# Patient Record
Sex: Female | Born: 1965 | Race: Black or African American | Hispanic: No | Marital: Single | Smoking: Never smoker
Health system: Southern US, Community
[De-identification: ages and names within clinical notes are randomized; demographics above are authoritative.]

## PROBLEM LIST (undated history)

## (undated) DIAGNOSIS — F329 Major depressive disorder, single episode, unspecified: Secondary | ICD-10-CM

## (undated) DIAGNOSIS — F32A Depression, unspecified: Secondary | ICD-10-CM

## (undated) DIAGNOSIS — E785 Hyperlipidemia, unspecified: Secondary | ICD-10-CM

## (undated) DIAGNOSIS — E119 Type 2 diabetes mellitus without complications: Secondary | ICD-10-CM

## (undated) DIAGNOSIS — C801 Malignant (primary) neoplasm, unspecified: Secondary | ICD-10-CM

## (undated) DIAGNOSIS — I10 Essential (primary) hypertension: Secondary | ICD-10-CM

## (undated) DIAGNOSIS — F419 Anxiety disorder, unspecified: Secondary | ICD-10-CM

## (undated) HISTORY — DX: Type 2 diabetes mellitus without complications: E11.9

## (undated) HISTORY — PX: CHOLECYSTECTOMY: SHX55

## (undated) HISTORY — DX: Major depressive disorder, single episode, unspecified: F32.9

## (undated) HISTORY — DX: Hyperlipidemia, unspecified: E78.5

## (undated) HISTORY — DX: Essential (primary) hypertension: I10

## (undated) HISTORY — DX: Anxiety disorder, unspecified: F41.9

## (undated) HISTORY — PX: BARIATRIC SURGERY: SHX1103

## (undated) HISTORY — DX: Depression, unspecified: F32.A

---

## 2012-12-22 HISTORY — PX: CAROTID STENT: SHX1301

## 2014-09-02 ENCOUNTER — Emergency Department: Payer: Self-pay | Admitting: Student

## 2014-11-18 ENCOUNTER — Emergency Department: Payer: Managed Care, Other (non HMO)

## 2014-11-18 ENCOUNTER — Emergency Department
Admission: EM | Admit: 2014-11-18 | Discharge: 2014-11-18 | Disposition: A | Discharge status: Home / Self Care | Payer: Managed Care, Other (non HMO) | Attending: Emergency Medicine | Admitting: Emergency Medicine

## 2014-11-18 DIAGNOSIS — K8020 Calculus of gallbladder without cholecystitis without obstruction: Secondary | ICD-10-CM | POA: Insufficient documentation

## 2014-11-18 DIAGNOSIS — R1011 Right upper quadrant pain: Secondary | ICD-10-CM | POA: Diagnosis present

## 2014-11-18 DIAGNOSIS — Z7982 Long term (current) use of aspirin: Secondary | ICD-10-CM | POA: Insufficient documentation

## 2014-11-18 DIAGNOSIS — R109 Unspecified abdominal pain: Secondary | ICD-10-CM

## 2014-11-18 DIAGNOSIS — Z3202 Encounter for pregnancy test, result negative: Secondary | ICD-10-CM | POA: Insufficient documentation

## 2014-11-18 DIAGNOSIS — Z79899 Other long term (current) drug therapy: Secondary | ICD-10-CM | POA: Diagnosis not present

## 2014-11-18 DIAGNOSIS — R112 Nausea with vomiting, unspecified: Secondary | ICD-10-CM

## 2014-11-18 LAB — COMPREHENSIVE METABOLIC PANEL
ALT: 14 U/L (ref 14–54)
AST: 18 U/L (ref 15–41)
Albumin: 3.5 g/dL (ref 3.5–5.0)
Alkaline Phosphatase: 90 U/L (ref 38–126)
Anion gap: 9 (ref 5–15)
BUN: 13 mg/dL (ref 6–20)
CHLORIDE: 103 mmol/L (ref 101–111)
CO2: 27 mmol/L (ref 22–32)
Calcium: 8.2 mg/dL — ABNORMAL LOW (ref 8.9–10.3)
Creatinine, Ser: 0.89 mg/dL (ref 0.44–1.00)
GFR calc Af Amer: >60 mL/min (ref >60)
Glucose, Bld: 143 mg/dL — ABNORMAL HIGH (ref 65–99)
POTASSIUM: 3.6 mmol/L (ref 3.5–5.1)
SODIUM: 139 mmol/L (ref 135–145)
TOTAL PROTEIN: 7 g/dL (ref 6.5–8.1)
Total Bilirubin: 0.5 mg/dL (ref 0.3–1.2)

## 2014-11-18 LAB — URINALYSIS COMPLETE WITH MICROSCOPIC (ARMC ONLY)
Bacteria, UA: NONE SEEN
Bilirubin Urine: NEGATIVE
Glucose, UA: NEGATIVE mg/dL
HGB URINE DIPSTICK: NEGATIVE
Ketones, ur: NEGATIVE mg/dL
LEUKOCYTES UA: NEGATIVE
Nitrite: NEGATIVE
Protein, ur: NEGATIVE mg/dL
SPECIFIC GRAVITY, URINE: 1.017 (ref 1.005–1.030)
pH: 5 (ref 5.0–8.0)

## 2014-11-18 LAB — TROPONIN I: Troponin I: <0.03 ng/mL (ref <0.031)

## 2014-11-18 LAB — CBC WITH DIFFERENTIAL/PLATELET
BASOS ABS: 0 10*3/uL (ref 0–0.1)
Basophils Relative: 0 %
Eosinophils Absolute: 0.1 10*3/uL (ref 0–0.7)
Eosinophils Relative: 1 %
HCT: 39 % (ref 35.0–47.0)
HEMOGLOBIN: 12.8 g/dL (ref 12.0–16.0)
LYMPHS PCT: 29 %
Lymphs Abs: 1.4 10*3/uL (ref 1.0–3.6)
MCH: 29.8 pg (ref 26.0–34.0)
MCHC: 32.7 g/dL (ref 32.0–36.0)
MCV: 91.1 fL (ref 80.0–100.0)
MONO ABS: 0.5 10*3/uL (ref 0.2–0.9)
Monocytes Relative: 10 %
Neutro Abs: 2.9 10*3/uL (ref 1.4–6.5)
Neutrophils Relative %: 60 %
Platelets: 183 10*3/uL (ref 150–440)
RBC: 4.28 MIL/uL (ref 3.80–5.20)
RDW: 14.6 % — ABNORMAL HIGH (ref 11.5–14.5)
WBC: 4.8 10*3/uL (ref 3.6–11.0)

## 2014-11-18 LAB — PREGNANCY, URINE: Preg Test, Ur: NEGATIVE

## 2014-11-18 LAB — LIPASE, BLOOD: Lipase: 42 U/L (ref 22–51)

## 2014-11-18 MED ORDER — ONDANSETRON HCL 4 MG PO TABS: 4.0000 mg | ORAL_TABLET | Freq: Every day | ORAL | Status: DC | PRN

## 2014-11-18 MED ORDER — DICYCLOMINE HCL 20 MG PO TABS: 20.0000 mg | ORAL_TABLET | Freq: Three times a day (TID) | ORAL | Status: DC | PRN

## 2014-11-18 MED ORDER — SODIUM CHLORIDE 0.9 % IV BOLUS (SEPSIS)
1000.0000 mL | Freq: Once | INTRAVENOUS | Status: AC
  Administered 2014-11-18: 1000 mL via INTRAVENOUS

## 2014-11-18 MED ORDER — OXYCODONE-ACETAMINOPHEN 5-325 MG PO TABS: 1.0000 | ORAL_TABLET | Freq: Four times a day (QID) | ORAL | Status: DC | PRN

## 2014-11-18 NOTE — ED Notes (Signed)
Labs redrawn and relabeled per lab request. Awaiting urine specimen.

## 2014-11-18 NOTE — Discharge Instructions (Signed)
Biliary Colic  Biliary colic is a steady or irregular pain in the upper abdomen. It is usually under the right side of the rib cage. It happens when gallstones interfere with the normal flow of bile from the gallbladder. Bile is a liquid that helps to digest fats. Bile is made in the liver and stored in the gallbladder. When you eat a meal, bile passes from the gallbladder through the cystic duct and the common bile duct into the small intestine. There, it mixes with partially digested food. If a gallstone blocks either of these ducts, the normal flow of bile is blocked. The muscle cells in the bile duct contract forcefully to try to move the stone. This causes the pain of biliary colic.  SYMPTOMS   A person with biliary colic usually complains of pain in the upper abdomen. This pain can be:  In the center of the upper abdomen just below the breastbone.  In the upper-right part of the abdomen, near the gallbladder and liver.  Spread back toward the right shoulder blade.  Nausea and vomiting.  The pain usually occurs after eating.  Biliary colic is usually triggered by the digestive system's demand for bile. The demand for bile is high after fatty meals. Symptoms can also occur when a person who has been fasting suddenly eats a very large meal. Most episodes of biliary colic pass after 1 to 5 hours. After the most intense pain passes, your abdomen may continue to ache mildly for about 24 hours. DIAGNOSIS  After you describe your symptoms, your caregiver will perform a physical exam. He or she will pay attention to the upper right portion of your belly (abdomen). This is the area of your liver and gallbladder. An ultrasound will help your caregiver look for gallstones. Specialized scans of the gallbladder may also be done. Blood tests may be done, especially if you have fever or if your pain persists. PREVENTION  Biliary colic can be prevented by controlling the risk factors for gallstones. Some of  these risk factors, such as heredity, increasing age, and pregnancy are a normal part of life. Obesity and a high-fat diet are risk factors you can change through a healthy lifestyle. Women going through menopause who take hormone replacement therapy (estrogen) are also more likely to develop biliary colic. TREATMENT   Pain medication may be prescribed.  You may be encouraged to eat a fat-free diet.  If the first episode of biliary colic is severe, or episodes of colic keep retuning, surgery to remove the gallbladder (cholecystectomy) is usually recommended. This procedure can be done through small incisions using an instrument called a laparoscope. The procedure often requires a brief stay in the hospital. Some people can leave the hospital the same day. It is the most widely used treatment in people troubled by painful gallstones. It is effective and safe, with no complications in more than 90% of cases.  If surgery cannot be done, medication that dissolves gallstones may be used. This medication is expensive and can take months or years to work. Only small stones will dissolve.  Rarely, medication to dissolve gallstones is combined with a procedure called shock-wave lithotripsy. This procedure uses carefully aimed shock waves to break up gallstones. In many people treated with this procedure, gallstones form again within a few years. PROGNOSIS  If gallstones block your cystic duct or common bile duct, you are at risk for repeated episodes of biliary colic. There is also a 25% chance that you will develop  a gallbladder infection(acute cholecystitis), or some other complication of gallstones within 10 to 20 years. If you have surgery, schedule it at a time that is convenient for you and at a time when you are not sick. HOME CARE INSTRUCTIONS   Drink plenty of clear fluids.  Avoid fatty, greasy or fried foods, or any foods that make your pain worse.  Take medications as directed. SEEK MEDICAL  CARE IF:   You develop a fever over 100.5 F (38.1 C).  Your pain gets worse over time.  You develop nausea that prevents you from eating and drinking.  You develop vomiting. SEEK IMMEDIATE MEDICAL CARE IF:   You have continuous or severe belly (abdominal) pain which is not relieved with medications.  You develop nausea and vomiting which is not relieved with medications.  You have symptoms of biliary colic and you suddenly develop a fever and shaking chills. This may signal cholecystitis. Call your caregiver immediately.  You develop a yellow color to your skin or the white part of your eyes (jaundice). Document Released: 10/30/2005 Document Revised: 08/21/2011 Document Reviewed: 01/09/2008 [facility] Patient Information 2015 [place], [place]. This information is not intended to replace advice given to you by your health care provider. Make sure you discuss any questions you have with your health care provider.

## 2014-11-18 NOTE — ED Notes (Signed)
Patient resting comfortably on stretcher. Awaiting further orders. Family at bedside.
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
## 2014-11-18 NOTE — ED Notes (Signed)
Patient ambulatory to triage with steady gait, without difficulty or distress noted; pt reports since Monday having N/V/D and chills; pain to right side abd radiating around into back

## 2014-11-23 ENCOUNTER — Encounter: Payer: Self-pay | Admitting: General Surgery

## 2014-11-23 ENCOUNTER — Ambulatory Visit (INDEPENDENT_AMBULATORY_CARE_PROVIDER_SITE_OTHER): Payer: Managed Care, Other (non HMO) | Admitting: General Surgery

## 2014-11-23 VITALS — BP 136/78 | HR 78 | Resp 16 | Ht 70.0 in | Wt 334.0 lb

## 2014-11-23 DIAGNOSIS — K8020 Calculus of gallbladder without cholecystitis without obstruction: Secondary | ICD-10-CM

## 2014-11-23 NOTE — Progress Notes (Signed)
Patient ID: Tamara Meyer, female   DOB: 06-Mar-1966, 49 y.o.   MRN: [id number]  Chief Complaint  Patient presents with  . Other    gallbladder    HPI Tamara Meyer is a 48 y.o. female here for assessment of possible gallstones. Abdominal pain started on 11/16/14. Pain worsened on 11/18/14 and she went to the ER, where she had a chest xray and abdominal ultrasound. The pain radiates from the right upper quadrant and extends towards her back. The pain is constant.  Positive for nausea and vomiting. Has not been able to eat regularly, only able to keep down bread or toast. She does move bowel regularly, she received a prescription for the pain and has caused some constipation. Had a prior episode in 2015 and ER physician suggested acid reflux medication. Has discussed bariatric surgery with PCP. Prior sleep study, but unsure if this indicated sleep apnea. Does not currently use a CPAP.  HPI  Past Medical History  Diagnosis Date  . Diabetes mellitus without complication   . Hyperlipidemia     Past Surgical History  Procedure Laterality Date  . Carotid stent  12/22/12    Family History  Problem Relation Age of Onset  . Heart disease Mother   . Cancer Mother     BREAST CANCER     Social History History  Substance Use Topics  . Smoking status: Never Smoker   . Smokeless tobacco: Never Used  . Alcohol Use: No    Allergies  Allergen Reactions  . Lisinopril Swelling and Other (See Comments)    Reaction: Throat closes    Current Outpatient Prescriptions  Medication Sig Dispense Refill  . aspirin EC 81 MG tablet Take 81 mg by mouth daily.    . carvedilol (COREG) 12.5 MG tablet Take 12.5 mg by mouth daily.    . Dapagliflozin-Metformin HCl ER (XIGDUO XR) 10-998 MG TB24 Take 2 tablets by mouth daily.    Marland Kitchen dicyclomine (BENTYL) 20 MG tablet Take 1 tablet (20 mg total) by mouth 3 (three) times daily as needed for spasms. 12 tablet 0  . gabapentin (NEURONTIN) 600 MG  tablet Take 600 mg by mouth daily.    Marland Kitchen ibuprofen (ADVIL,MOTRIN) 800 MG tablet Take 800 mg by mouth 3 (three) times daily as needed for moderate pain.    Marland Kitchen insulin aspart (NOVOLOG FLEXPEN) 100 UNIT/ML FlexPen Inject 40 Units into the skin 3 (three) times daily with meals.    . Insulin Detemir (LEVEMIR FLEXPEN) 100 UNIT/ML Pen Inject 50 Units into the skin 2 (two) times daily.    . ondansetron (ZOFRAN) 4 MG tablet Take 1 tablet (4 mg total) by mouth daily as needed for nausea or vomiting. 10 tablet 1  . oxyCODONE-acetaminophen (ROXICET) 5-325 MG per tablet Take 1-2 tablets by mouth every 6 (six) hours as needed. 15 tablet 0  . rosuvastatin (CRESTOR) 40 MG tablet Take 40 mg by mouth daily.     No current facility-administered medications for this visit.    Review of Systems Review of Systems  Constitutional: Positive for chills and appetite change.  Respiratory: Negative.   Gastrointestinal: Positive for nausea, vomiting and constipation.    Blood pressure 136/78, pulse 78, resp. rate 16, height 5' 10"  (1.778 m), weight 334 lb (151.501 kg), last menstrual period 10/27/2014.  Physical Exam Physical Exam  Constitutional: She is oriented to person, place, and time. She appears well-developed and well-nourished.  Eyes: No scleral icterus.  Neck: Neck supple.  Cardiovascular: Normal rate, regular  rhythm and normal heart sounds.   Pulmonary/Chest: Effort normal and breath sounds normal.  Abdominal: Soft. Bowel sounds are normal. She exhibits no distension and no mass. There is no hepatomegaly. There is no tenderness. There is no rebound and no guarding.  Lymphadenopathy:    She has no cervical adenopathy.  Neurological: She is alert and oriented to person, place, and time.  Skin: Skin is warm and dry.    Data Reviewed RUQ Ultrasound from 11/18/14 shows cholelithiasis without cholecystitis.  CBC and CMP and Lipase show normal levels.   Assessment    Cholelithiasis without obstruction.  Weight is biggest risk factor. Has a BMI of 48. She likely also has sleep apnea. It appears she had one attacke of pain last yr and one  few days ago.Has discussed bariatric surgery with PCP previously but no progress on that. Given her risk status feel lap/cholecystectomy can be very technically challenging and operative risks are very high. Discuss fully with pt. Will get bariatric surgery consult.      Plan    Discussed risk of surgery with current weight. Suggest referral to bariatric surgeon to discuss weight loss and advice prior to when cholecystectomy can be considered. Control potential gallbladder symptoms by eating bland food that does not have fat and grease      PCP: Vicki Mallet 11/23/2014, 3:51 PM

## 2014-11-23 NOTE — Patient Instructions (Addendum)
Control potential gallbladder symptoms by eating bland food that does not have fat and grease. Will make appointment with bariatric surgeon to discuss weight loss

## 2016-04-16 ENCOUNTER — Emergency Department
Admission: EM | Admit: 2016-04-16 | Discharge: 2016-04-16 | Disposition: A | Discharge status: Home / Self Care | Payer: BLUE CROSS/BLUE SHIELD | Attending: Emergency Medicine | Admitting: Emergency Medicine

## 2016-04-16 ENCOUNTER — Emergency Department: Payer: BLUE CROSS/BLUE SHIELD

## 2016-04-16 ENCOUNTER — Encounter: Payer: Self-pay | Admitting: Emergency Medicine

## 2016-04-16 DIAGNOSIS — Z7982 Long term (current) use of aspirin: Secondary | ICD-10-CM | POA: Insufficient documentation

## 2016-04-16 DIAGNOSIS — R05 Cough: Secondary | ICD-10-CM

## 2016-04-16 DIAGNOSIS — R079 Chest pain, unspecified: Secondary | ICD-10-CM | POA: Diagnosis not present

## 2016-04-16 DIAGNOSIS — R059 Cough, unspecified: Secondary | ICD-10-CM

## 2016-04-16 DIAGNOSIS — R0609 Other forms of dyspnea: Secondary | ICD-10-CM | POA: Insufficient documentation

## 2016-04-16 DIAGNOSIS — E119 Type 2 diabetes mellitus without complications: Secondary | ICD-10-CM | POA: Diagnosis not present

## 2016-04-16 DIAGNOSIS — Z79899 Other long term (current) drug therapy: Secondary | ICD-10-CM | POA: Insufficient documentation

## 2016-04-16 DIAGNOSIS — Z794 Long term (current) use of insulin: Secondary | ICD-10-CM | POA: Diagnosis not present

## 2016-04-16 LAB — CBC
HCT: 35.6 % (ref 35.0–47.0)
Hemoglobin: 12.1 g/dL (ref 12.0–16.0)
MCH: 30.6 pg (ref 26.0–34.0)
MCHC: 33.9 g/dL (ref 32.0–36.0)
MCV: 90.1 fL (ref 80.0–100.0)
PLATELETS: 216 10*3/uL (ref 150–440)
RBC: 3.95 MIL/uL (ref 3.80–5.20)
RDW: 15.3 % — ABNORMAL HIGH (ref 11.5–14.5)
WBC: 10.3 10*3/uL (ref 3.6–11.0)

## 2016-04-16 LAB — BASIC METABOLIC PANEL
Anion gap: 9 (ref 5–15)
BUN: 11 mg/dL (ref 6–20)
CHLORIDE: 103 mmol/L (ref 101–111)
CO2: 27 mmol/L (ref 22–32)
CREATININE: 0.86 mg/dL (ref 0.44–1.00)
Calcium: 8.9 mg/dL (ref 8.9–10.3)
GFR calc Af Amer: >60 mL/min (ref >60)
GFR calc non Af Amer: >60 mL/min (ref >60)
Glucose, Bld: 153 mg/dL — ABNORMAL HIGH (ref 65–99)
Potassium: 4.2 mmol/L (ref 3.5–5.1)
Sodium: 139 mmol/L (ref 135–145)

## 2016-04-16 LAB — TROPONIN I: Troponin I: <0.03 ng/mL (ref <0.03)

## 2016-04-16 LAB — BRAIN NATRIURETIC PEPTIDE: B NATRIURETIC PEPTIDE 5: 69 pg/mL (ref 0.0–100.0)

## 2016-04-16 MED ORDER — FUROSEMIDE 10 MG/ML IJ SOLN
40.0000 mg | Freq: Once | INTRAMUSCULAR | Status: AC
  Administered 2016-04-16: 40 mg via INTRAVENOUS
  Filled 2016-04-16: qty 4

## 2016-04-16 MED ORDER — ASPIRIN 81 MG PO CHEW
324.0000 mg | CHEWABLE_TABLET | Freq: Once | ORAL | Status: AC
  Administered 2016-04-16: 324 mg via ORAL
  Filled 2016-04-16: qty 4

## 2016-04-16 MED ORDER — BENZONATATE 100 MG PO CAPS
100.0000 mg | ORAL_CAPSULE | Freq: Once | ORAL | Status: AC
  Administered 2016-04-16: 100 mg via ORAL
  Filled 2016-04-16: qty 1

## 2016-04-16 NOTE — ED Triage Notes (Signed)
Pt reports central CP that radaites to left arm for several days. Pt also reports SOB.

## 2016-04-16 NOTE — ED Notes (Signed)
Patient transported to X-ray 

## 2016-04-16 NOTE — ED Provider Notes (Signed)
[facility] Emergency Department Provider Note  ____________________________________________  Time seen: Approximately 9:27 AM  I have reviewed the triage vital signs and the nursing notes.   HISTORY  Chief Complaint Chest Pain and Shortness of Breath    HPI Tamara Meyer is a 50 y.o. female with a history of CAD status post stent 2014, COPD and asthma, morbid obesity, DM, HL presenting with productive cough, left arm pain, left neck pain, chest pain, and shortness of breath. The patient reports that she returned from a beach vacation 10/25, and initially had intermittent left arm pain from the shoulder all the way down the arm. She also developed a cough that was initially nonproductive, but now is productive of thick sputum. She is noted progressively worsening dyspnea on exertion, with decreased exercise tolerance. She has been usingher albuterol inhaler with minimal relief. No orthopnea; she chronically sleeps using 4 pillows, which is unchanged. She denies any ear pain, sore throat, congestion or rhinorrhea. She has also been having an intermittent central chest pain without palpitations, lightheadedness or syncope. Her chest pain is better if she sits up and hold her breasts. This also alleviates her shortness of breath. She occasionally also has left neck pain. She has also intermittently had bilateral lower extremity swelling, although does not have this today. The patient has no appointment to see Dr. Ubaldo Glassing, her cardiologist, on 11/14; she has not had any risk stratification study since her cardiac catheterization in 2014 when the stent was placed.   Past Medical History:  Diagnosis Date  . Diabetes mellitus without complication ([place])   . Hyperlipidemia     Patient Active Problem List   Diagnosis Date Noted  . Cholelithiasis without obstruction 11/23/2014    Past Surgical History:  Procedure Laterality Date  . CAROTID STENT  12/22/12  Patient  denies carotid stent. She reports coronary artery stent.  Current Outpatient Rx  . Order #: [id number] Class: Historical Med  . Order #: [id number] Class: Historical Med  . Order #: [id number] Class: Historical Med  . Order #: [id number] Class: Print  . Order #: [id number] Class: Historical Med  . Order #: [id number] Class: Historical Med  . Order #: [id number] Class: Historical Med  . Order #: [id number] Class: Historical Med  . Order #: [id number] Class: Print  . Order #: [id number] Class: Print  . Order #: [id number] Class: Historical Med    Allergies Lisinopril  Family History  Problem Relation Age of Onset  . Heart disease Mother   . Cancer Mother     BREAST CANCER     Social History Social History  Substance Use Topics  . Smoking status: Never Smoker  . Smokeless tobacco: Never Used  . Alcohol use No    Review of Systems Constitutional: No fever/chills.No lightheadedness or syncope. No diaphoresis. Eyes: No visual changes. No eye discharge. ENT: No sore throat. No congestion or rhinorrhea. No ear pain. Cardiovascular: Positive central chest pain. Denies palpitations. Respiratory: Positive dyspnea on exertion, no orthopnea.  Positive productive cough. Gastrointestinal: No abdominal pain.  No nausea, no vomiting.  No diarrhea.  No constipation. Genitourinary: Negative for dysuria. Musculoskeletal: Negative for back pain. Positive left neck pain. Positive left upper extremity pain. Skin: Negative for rash. Neurological: Negative for headaches. No focal numbness, tingling or weakness.   10-point ROS otherwise negative.  ____________________________________________   PHYSICAL EXAM:  VITAL SIGNS: ED Triage Vitals  Enc Vitals Group     BP 04/16/16 0855 (!) 144/82     Pulse Rate 04/16/16 0855 92  Resp 04/16/16 0855 (!) 22     Temp 04/16/16 0855 99.3 F (37.4 C)     Temp Source 04/16/16 0855 Oral     SpO2 04/16/16 0855 92 %     Weight 04/16/16 0853 (!) 397 lb (180.1 kg)      Height 04/16/16 0853 5' 10"  (1.778 m)     Head Circumference --      Peak Flow --      Pain Score 04/16/16 0853 9     Pain Loc --      Pain Edu? --      Excl. in [place]? --     Constitutional: Alert and oriented. Chronically ill appearing but in no acute distress. Answers questions appropriately. Patient is sitting comfortably in the stretcher, smiling, and moves about without discomfort. Eyes: Conjunctivae are normal.  EOMI. No scleral icterus. No eye discharge. Head: Atraumatic. Nose: No congestion/rhinnorhea. Mouth/Throat: Mucous membranes are moist.  Neck: No stridor.  Supple.  No JVD. No meningismus. Cardiovascular: Heart sounds are distant due to body habitus. Normal rate, regular rhythm. No murmurs, rubs or gallops.  Respiratory: Normal respiratory effort.  No accessory muscle use or retractions. Lungs CTAB.  No wheezes, rales or ronchi. Gastrointestinal: Obese. Soft, nontender and nondistended.  No guarding or rebound.  No peritoneal signs. Musculoskeletal: No LE edema. No ttp in the calves or palpable cords.  Negative Homan's sign. Neurologic:  A&Ox3.  Speech is clear.  Face and smile are symmetric.  EOMI.  Moves all extremities well. Skin:  Skin is warm, dry and intact. No rash noted. Psychiatric: Mood and affect are normal. Speech and behavior are normal.  Normal judgement.  ____________________________________________   LABS (all labs ordered are listed, but only abnormal results are displayed)  Labs Reviewed  BASIC METABOLIC PANEL - Abnormal; Notable for the following:       Result Value   Glucose, Bld 153 (*)    All other components within normal limits  CBC - Abnormal; Notable for the following:    RDW 15.3 (*)    All other components within normal limits  TROPONIN I  BRAIN NATRIURETIC PEPTIDE  TROPONIN I   ____________________________________________  EKG  ED ECG REPORT I, Eula Listen, the attending physician, personally viewed and interpreted this  ECG.   Date: 04/16/2016  EKG Time: 855  Rate: 92  Rhythm: normal sinus rhythm  Axis: normal  Intervals:none  ST&T Change: Nonspecific T-wave inversion in V1. No ST elevation.  ____________________________________________  [id number]  Dg Chest 2 View  Result Date: 04/16/2016 CLINICAL DATA:  Shortness of breath for several weeks EXAM: CHEST  2 VIEW COMPARISON:  11/18/2014 FINDINGS: Cardiac shadow is at the upper limits of normal in size. The lungs are well aerated bilaterally. No focal infiltrate or sizable effusion is seen. Mild vascular congestion is noted consistent with early CHF. No bony abnormality is noted. IMPRESSION: Early CHF Electronically Signed   By: Inez Catalina M.D.   On: 04/16/2016 09:49    ____________________________________________   PROCEDURES  Procedure(s) performed: None  Procedures  Critical Care performed: No ____________________________________________   INITIAL IMPRESSION / ASSESSMENT AND PLAN / ED COURSE  Pertinent labs & imaging results that were available during my care of the patient were reviewed by me and considered in my medical decision making (see chart for details).  50 y.o. female with CAD status post stent, COPD and asthma, presenting with productive cough, dyspnea on exertion, chest pain, left upper extremity pain, left neck pain,  and intermittent lower extremity swelling. It is possible that the patient's symptoms are due to viral URI, but given her multiple cardiac and pulmonary comorbidities, we'll also evaluate her for ACS or MI, arrhythmia, pneumonia. This patient has not had any risk stratification study in 3 years.  PE or aortic pathology is less likely. It is possible the patient has CHF although I do not hear any pulmonary edema or see any signs of peripheral edema on my examination.  ----------------------------------------- 12:43 PM on 04/16/2016 -----------------------------------------  At this time, the patient states that  she is feeling much better. She is not having any chest pain. The patient's cardiac workup is reassuring with a normal BNP, troponin negative 2, and EKG without ischemic changes. Her chest x-ray may show some early pulmonary edema, which I have treated with Lasix and she has had good urine output from this. She does feel like she is breathing well at this time.  I will discuss the patient's clinical history and findings with Dr. Ubaldo Glassing, for final disposition.  ----------------------------------------- 12:50 PM on 04/16/2016 -----------------------------------------  I have spoken with Dr. Ubaldo Glassing, who agrees the plan and will see the patient in clinic. ____________________________________________  FINAL CLINICAL IMPRESSION(S) / ED DIAGNOSES  Final diagnoses:  Dyspnea on exertion  Chest pain, unspecified type  Cough    Clinical Course as of Apr 16 1249  Sun Apr 16, 2016  1029 The patient's chest x-ray may show some early pulmonary edema, although clinically her oxygenation is normal, I do not hear any rales or rhonchi, and she has no significant peripheral edema. Her BNP today is 69. I will treat her with 40 mg IV of Lasix, to see if this helps relieve some of her dyspnea on exertion. Her initial troponin is also negative and the remainder of her labs are reassuring, including a normal white blood cell count. We will await a second troponin, and then discuss final disposition with Dr. Ubaldo Glassing.  [AN]    Clinical Course User Index [AN] Eula Listen, MD      NEW MEDICATIONS STARTED DURING THIS VISIT:  New Prescriptions   No medications on file      Eula Listen, MD 04/16/16 1250

## 2016-04-16 NOTE — Discharge Instructions (Signed)
Please continue to take your albuterol as needed for coughing spasms or shortness of breath.  Please call Dr. Velia Meyer office tomorrow morning, to schedule a follow-up appointment as soon as possible.  Return to the emergency department if you develop chest pain, shortness of breath, lightheadedness or fainting, swelling of the legs, fever, or any other symptoms concerning to you.

## 2016-04-21 ENCOUNTER — Other Ambulatory Visit: Payer: Self-pay | Admitting: Cardiology

## 2016-04-21 DIAGNOSIS — R0602 Shortness of breath: Secondary | ICD-10-CM

## 2016-04-21 DIAGNOSIS — I2510 Atherosclerotic heart disease of native coronary artery without angina pectoris: Secondary | ICD-10-CM

## 2016-04-27 ENCOUNTER — Ambulatory Visit
Admission: RE | Admit: 2016-04-27 | Discharge: 2016-04-27 | Disposition: A | Discharge status: Home / Self Care | Payer: BLUE CROSS/BLUE SHIELD | Source: Ambulatory Visit | Attending: Cardiology | Admitting: Cardiology

## 2016-04-27 ENCOUNTER — Encounter
Admission: RE | Admit: 2016-04-27 | Discharge: 2016-04-27 | Disposition: A | Discharge status: Home / Self Care | Payer: BLUE CROSS/BLUE SHIELD | Source: Ambulatory Visit | Attending: Cardiology | Admitting: Cardiology

## 2016-04-27 DIAGNOSIS — I2510 Atherosclerotic heart disease of native coronary artery without angina pectoris: Secondary | ICD-10-CM

## 2016-04-27 DIAGNOSIS — R0602 Shortness of breath: Secondary | ICD-10-CM

## 2016-04-27 MED ORDER — PERFLUTREN LIPID MICROSPHERE
1.0000 mL | INTRAVENOUS | Status: AC | PRN
  Administered 2016-04-27: 2 mL via INTRAVENOUS
  Filled 2016-04-27: qty 10

## 2016-04-27 MED ORDER — REGADENOSON 0.4 MG/5ML IV SOLN
0.4000 mg | Freq: Once | INTRAVENOUS | Status: AC
  Administered 2016-04-27: 0.4 mg via INTRAVENOUS

## 2016-04-27 MED ORDER — TECHNETIUM TC 99M TETROFOSMIN IV KIT
32.2800 | PACK | Freq: Once | INTRAVENOUS | Status: AC | PRN
  Administered 2016-04-27: 32.28 via INTRAVENOUS

## 2016-04-27 NOTE — Progress Notes (Signed)
  Echocardiogram 2D Echocardiogram has been performed.  Jennette Dubin 04/27/2016, 11:36 AM

## 2016-04-28 ENCOUNTER — Encounter
Admission: RE | Admit: 2016-04-28 | Discharge: 2016-04-28 | Disposition: A | Discharge status: Home / Self Care | Payer: BLUE CROSS/BLUE SHIELD | Source: Ambulatory Visit | Attending: Cardiology | Admitting: Cardiology

## 2016-04-28 DIAGNOSIS — R0602 Shortness of breath: Secondary | ICD-10-CM | POA: Diagnosis not present

## 2016-04-28 MED ORDER — TECHNETIUM TC 99M TETROFOSMIN IV KIT
30.0370 | PACK | Freq: Once | INTRAVENOUS | Status: AC | PRN
  Administered 2016-04-28: 30.037 via INTRAVENOUS

## 2016-07-13 LAB — NM MYOCAR MULTI W/SPECT W/WALL MOTION / EF
CHL CUP NUCLEAR SRS: 10
CHL CUP RESTING HR STRESS: 78 {beats}/min
CSEPED: 1 min
CSEPHR: 56 %
CSEPPHR: 98 {beats}/min
Estimated workload: 1 METS
Exercise duration (sec): 1 s
LV sys vol: 72 mL
LVDIAVOL: 136 mL (ref 46–106)
MPHR: 172 {beats}/min
SDS: 5
SSS: 13
TID: 1.1

## 2016-07-27 ENCOUNTER — Ambulatory Visit: Payer: BLUE CROSS/BLUE SHIELD | Attending: Specialist

## 2016-08-16 ENCOUNTER — Ambulatory Visit: Payer: BLUE CROSS/BLUE SHIELD | Attending: Specialist

## 2016-08-16 DIAGNOSIS — G4733 Obstructive sleep apnea (adult) (pediatric): Secondary | ICD-10-CM | POA: Diagnosis present

## 2017-03-07 DIAGNOSIS — Z9989 Dependence on other enabling machines and devices: Secondary | ICD-10-CM | POA: Insufficient documentation

## 2017-03-07 DIAGNOSIS — K7581 Nonalcoholic steatohepatitis (NASH): Secondary | ICD-10-CM | POA: Insufficient documentation

## 2017-03-07 DIAGNOSIS — E1165 Type 2 diabetes mellitus with hyperglycemia: Secondary | ICD-10-CM | POA: Insufficient documentation

## 2017-03-07 DIAGNOSIS — G4733 Obstructive sleep apnea (adult) (pediatric): Secondary | ICD-10-CM | POA: Insufficient documentation

## 2017-03-07 DIAGNOSIS — Z794 Long term (current) use of insulin: Secondary | ICD-10-CM

## 2017-03-07 DIAGNOSIS — I10 Essential (primary) hypertension: Secondary | ICD-10-CM | POA: Insufficient documentation

## 2017-03-07 DIAGNOSIS — E119 Type 2 diabetes mellitus without complications: Secondary | ICD-10-CM | POA: Insufficient documentation

## 2017-03-08 DIAGNOSIS — Z955 Presence of coronary angioplasty implant and graft: Secondary | ICD-10-CM | POA: Insufficient documentation

## 2017-03-08 DIAGNOSIS — I2510 Atherosclerotic heart disease of native coronary artery without angina pectoris: Secondary | ICD-10-CM | POA: Insufficient documentation

## 2017-03-28 ENCOUNTER — Ambulatory Visit: Payer: BLUE CROSS/BLUE SHIELD | Attending: Internal Medicine

## 2017-03-28 DIAGNOSIS — R2689 Other abnormalities of gait and mobility: Secondary | ICD-10-CM | POA: Insufficient documentation

## 2017-03-28 DIAGNOSIS — G8929 Other chronic pain: Secondary | ICD-10-CM | POA: Diagnosis present

## 2017-03-28 DIAGNOSIS — M6281 Muscle weakness (generalized): Secondary | ICD-10-CM | POA: Diagnosis present

## 2017-03-28 DIAGNOSIS — M5441 Lumbago with sciatica, right side: Secondary | ICD-10-CM | POA: Insufficient documentation
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
## 2017-04-03 ENCOUNTER — Ambulatory Visit: Payer: BLUE CROSS/BLUE SHIELD

## 2017-04-09 DIAGNOSIS — K760 Fatty (change of) liver, not elsewhere classified: Secondary | ICD-10-CM | POA: Insufficient documentation

## 2017-04-10 ENCOUNTER — Ambulatory Visit: Payer: BLUE CROSS/BLUE SHIELD

## 2017-04-10 DIAGNOSIS — M5441 Lumbago with sciatica, right side: Principal | ICD-10-CM

## 2017-04-10 DIAGNOSIS — G8929 Other chronic pain: Secondary | ICD-10-CM

## 2017-04-10 DIAGNOSIS — M6281 Muscle weakness (generalized): Secondary | ICD-10-CM

## 2017-04-10 DIAGNOSIS — R2689 Other abnormalities of gait and mobility: Secondary | ICD-10-CM
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
## 2017-04-12 ENCOUNTER — Ambulatory Visit: Payer: BLUE CROSS/BLUE SHIELD

## 2017-04-17 ENCOUNTER — Ambulatory Visit: Payer: BLUE CROSS/BLUE SHIELD | Attending: Internal Medicine

## 2017-04-17 DIAGNOSIS — M5441 Lumbago with sciatica, right side: Secondary | ICD-10-CM | POA: Insufficient documentation

## 2017-04-17 DIAGNOSIS — G8929 Other chronic pain: Secondary | ICD-10-CM | POA: Insufficient documentation

## 2017-04-17 DIAGNOSIS — R2689 Other abnormalities of gait and mobility: Secondary | ICD-10-CM

## 2017-04-17 DIAGNOSIS — M6281 Muscle weakness (generalized): Secondary | ICD-10-CM | POA: Diagnosis present
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
## 2017-04-19 ENCOUNTER — Ambulatory Visit: Payer: BLUE CROSS/BLUE SHIELD

## 2017-04-24 ENCOUNTER — Ambulatory Visit: Payer: BLUE CROSS/BLUE SHIELD

## 2017-04-26 ENCOUNTER — Ambulatory Visit: Payer: BLUE CROSS/BLUE SHIELD

## 2017-05-01 ENCOUNTER — Ambulatory Visit: Payer: BLUE CROSS/BLUE SHIELD

## 2017-05-07 ENCOUNTER — Emergency Department: Payer: BLUE CROSS/BLUE SHIELD

## 2017-05-07 ENCOUNTER — Encounter: Payer: Self-pay | Admitting: Emergency Medicine

## 2017-05-07 ENCOUNTER — Emergency Department
Admission: EM | Admit: 2017-05-07 | Discharge: 2017-05-07 | Disposition: A | Discharge status: Home / Self Care | Payer: BLUE CROSS/BLUE SHIELD | Attending: Emergency Medicine | Admitting: Emergency Medicine

## 2017-05-07 DIAGNOSIS — Z955 Presence of coronary angioplasty implant and graft: Secondary | ICD-10-CM | POA: Diagnosis not present

## 2017-05-07 DIAGNOSIS — Z794 Long term (current) use of insulin: Secondary | ICD-10-CM | POA: Diagnosis not present

## 2017-05-07 DIAGNOSIS — I10 Essential (primary) hypertension: Secondary | ICD-10-CM | POA: Insufficient documentation

## 2017-05-07 DIAGNOSIS — E119 Type 2 diabetes mellitus without complications: Secondary | ICD-10-CM | POA: Insufficient documentation

## 2017-05-07 DIAGNOSIS — K219 Gastro-esophageal reflux disease without esophagitis: Secondary | ICD-10-CM | POA: Diagnosis not present

## 2017-05-07 DIAGNOSIS — J449 Chronic obstructive pulmonary disease, unspecified: Secondary | ICD-10-CM | POA: Insufficient documentation

## 2017-05-07 DIAGNOSIS — R0602 Shortness of breath: Secondary | ICD-10-CM | POA: Insufficient documentation

## 2017-05-07 DIAGNOSIS — I2510 Atherosclerotic heart disease of native coronary artery without angina pectoris: Secondary | ICD-10-CM | POA: Insufficient documentation

## 2017-05-07 DIAGNOSIS — R1012 Left upper quadrant pain: Secondary | ICD-10-CM | POA: Diagnosis present

## 2017-05-07 DIAGNOSIS — Z79899 Other long term (current) drug therapy: Secondary | ICD-10-CM | POA: Insufficient documentation

## 2017-05-07 LAB — HEPATIC FUNCTION PANEL
ALT: 16 U/L (ref 14–54)
AST: 20 U/L (ref 15–41)
Albumin: 3.9 g/dL (ref 3.5–5.0)
Alkaline Phosphatase: 104 U/L (ref 38–126)
BILIRUBIN TOTAL: 0.6 mg/dL (ref 0.3–1.2)
Bilirubin, Direct: <0.1 mg/dL — ABNORMAL LOW (ref 0.1–0.5)
TOTAL PROTEIN: 7.8 g/dL (ref 6.5–8.1)

## 2017-05-07 LAB — URINALYSIS, COMPLETE (UACMP) WITH MICROSCOPIC
BACTERIA UA: NONE SEEN
BILIRUBIN URINE: NEGATIVE
GLUCOSE, UA: NEGATIVE mg/dL
Ketones, ur: NEGATIVE mg/dL
Leukocytes, UA: NEGATIVE
NITRITE: NEGATIVE
PROTEIN: NEGATIVE mg/dL
Specific Gravity, Urine: 1.018 (ref 1.005–1.030)
pH: 5 (ref 5.0–8.0)

## 2017-05-07 LAB — BASIC METABOLIC PANEL
ANION GAP: 12 (ref 5–15)
BUN: 11 mg/dL (ref 6–20)
CALCIUM: 9.3 mg/dL (ref 8.9–10.3)
CO2: 24 mmol/L (ref 22–32)
Chloride: 101 mmol/L (ref 101–111)
Creatinine, Ser: 0.82 mg/dL (ref 0.44–1.00)
GLUCOSE: 132 mg/dL — AB (ref 65–99)
Potassium: 3.9 mmol/L (ref 3.5–5.1)
Sodium: 137 mmol/L (ref 135–145)

## 2017-05-07 LAB — BRAIN NATRIURETIC PEPTIDE: B NATRIURETIC PEPTIDE 5: 72 pg/mL (ref 0.0–100.0)

## 2017-05-07 LAB — INFLUENZA PANEL BY PCR (TYPE A & B)
INFLAPCR: NEGATIVE
INFLBPCR: NEGATIVE

## 2017-05-07 LAB — TROPONIN I

## 2017-05-07 LAB — CBC
HEMATOCRIT: 37.7 % (ref 35.0–47.0)
HEMOGLOBIN: 12.8 g/dL (ref 12.0–16.0)
MCH: 29.5 pg (ref 26.0–34.0)
MCHC: 34 g/dL (ref 32.0–36.0)
MCV: 86.8 fL (ref 80.0–100.0)
Platelets: 239 10*3/uL (ref 150–440)
RBC: 4.34 MIL/uL (ref 3.80–5.20)
RDW: 15.3 % — ABNORMAL HIGH (ref 11.5–14.5)
WBC: 7.8 10*3/uL (ref 3.6–11.0)

## 2017-05-07 LAB — PREGNANCY, URINE: Preg Test, Ur: NEGATIVE

## 2017-05-07 LAB — LIPASE, BLOOD: Lipase: 28 U/L (ref 11–51)

## 2017-05-07 MED ORDER — METOCLOPRAMIDE HCL 10 MG PO TABS: 10.0000 mg | ORAL_TABLET | Freq: Three times a day (TID) | ORAL | 0 refills | Status: DC | PRN

## 2017-05-07 MED ORDER — MAGNESIUM CITRATE PO SOLN: 1.0000 | Freq: Once | ORAL | 0 refills | Status: AC

## 2017-05-07 MED ORDER — FAMOTIDINE 20 MG PO TABS
20.0000 mg | ORAL_TABLET | Freq: Once | ORAL | Status: AC
  Administered 2017-05-07: 20 mg via ORAL
  Filled 2017-05-07: qty 1

## 2017-05-07 MED ORDER — SODIUM CHLORIDE 0.9 % IV BOLUS (SEPSIS)
500.0000 mL | Freq: Once | INTRAVENOUS | Status: AC
  Administered 2017-05-07: 500 mL via INTRAVENOUS

## 2017-05-07 MED ORDER — ONDANSETRON HCL 4 MG/2ML IJ SOLN
4.0000 mg | Freq: Once | INTRAMUSCULAR | Status: AC
  Administered 2017-05-07: 4 mg via INTRAVENOUS
  Filled 2017-05-07: qty 2

## 2017-05-07 MED ORDER — IOPAMIDOL (ISOVUE-300) INJECTION 61%
125.0000 mL | Freq: Once | INTRAVENOUS | Status: AC | PRN
  Administered 2017-05-07: 125 mL via INTRAVENOUS

## 2017-05-07 MED ORDER — IOPAMIDOL (ISOVUE-300) INJECTION 61%
30.0000 mL | Freq: Once | INTRAVENOUS | Status: AC | PRN
  Administered 2017-05-07: 30 mL via ORAL

## 2017-05-07 MED ORDER — METOCLOPRAMIDE HCL 10 MG PO TABS
10.0000 mg | ORAL_TABLET | Freq: Once | ORAL | Status: AC
  Administered 2017-05-07: 10 mg via ORAL
  Filled 2017-05-07: qty 1

## 2017-05-07 MED ORDER — MORPHINE SULFATE (PF) 2 MG/ML IV SOLN
2.0000 mg | Freq: Once | INTRAVENOUS | Status: AC
  Administered 2017-05-07: 2 mg via INTRAVENOUS
  Filled 2017-05-07: qty 1

## 2017-05-07 MED ORDER — FAMOTIDINE 20 MG PO TABS: 20.0000 mg | ORAL_TABLET | Freq: Two times a day (BID) | ORAL | 0 refills | Status: DC

## 2017-05-07 NOTE — ED Triage Notes (Signed)
Pt comes into the ED via chest pain that started Saturday, but the patient states she is now vomiting as well.  Patient states she has also not been able to have a bowel movement since Saturday.  Patient has even and unlabored respirations and the chest pain is on the left side that radiates to the back.  Patient also complains of shortness of breath associated with her symptoms.

## 2017-05-07 NOTE — ED Notes (Signed)
Patient transported to CT 

## 2017-05-07 NOTE — Discharge Instructions (Signed)
Take the Pepcid as prescribed to decrease the acid and pain going into your chest.  You may take the Reglan as needed for nausea, and the magnesium citrate as needed for constipation.  Return to the emergency department for new, worsening, or persistent vomiting, abdominal pain, fevers, difficulty breathing or chest pain, weakness, or any other new or worsening symptoms that concern you.  You have a small amount of fluid at the bottom of the right lung, called a "pleural effusion," which does not seem to be related to your symptoms today, but should be further evaluated by your regular doctor.  You also have a cyst on your left ovary which again is not related to your symptoms today but should be followed up by a gynecologist.

## 2017-05-07 NOTE — ED Provider Notes (Signed)
[facility] Emergency Department Provider Note ____________________________________________   First MD Initiated Contact with Patient 05/07/17 1619     (approximate)  I have reviewed the triage vital signs and the nursing notes.   HISTORY  Chief Complaint Chest Pain    HPI Tamara Meyer is a 51 y.o. female with a history of diabetes, hypertension, hyperlipidemia, CAD status post stenting 2014, COPD and asthma who presents with primarily left upper quadrant abdominal pain over the last several days, gradual onset, radiating to the chest and under the left breast, and associated with generalized malaise and weakness, some shortness of breath, nausea, and several episodes of vomiting.  Patient also states that she has not had a bowel movement in the last several days, which is abnormal for her.  She states her abdomen is slightly distended.  She denies any cough or fever.  She states that a few weeks ago she had flulike symptoms, and states that she feels worse now than she did then.  Past Medical History:  Diagnosis Date  . Diabetes mellitus without complication ([place])   . Hyperlipidemia     Patient Active Problem List   Diagnosis Date Noted  . Cholelithiasis without obstruction 11/23/2014    Past Surgical History:  Procedure Laterality Date  . CAROTID STENT  12/22/12    Prior to Admission medications   Medication Sig Start Date End Date Taking? Authorizing Provider  aspirin EC 81 MG tablet Take 81 mg by mouth daily.    [provider]  carvedilol (COREG) 12.5 MG tablet Take 12.5 mg by mouth daily.    [provider]  Dapagliflozin-Metformin HCl ER (XIGDUO XR) 10-998 MG TB24 Take 2 tablets by mouth daily.    [provider]  dicyclomine (BENTYL) 20 MG tablet Take 1 tablet (20 mg total) by mouth 3 (three) times daily as needed for spasms. 11/18/14 11/18/15  Orbie Pyo, MD  gabapentin (NEURONTIN) 600 MG  tablet Take 600 mg by mouth daily.    [provider]  ibuprofen (ADVIL,MOTRIN) 800 MG tablet Take 800 mg by mouth 3 (three) times daily as needed for moderate pain.    [provider]  insulin aspart (NOVOLOG FLEXPEN) 100 UNIT/ML FlexPen Inject 40 Units into the skin 3 (three) times daily with meals.    [provider]  Insulin Detemir (LEVEMIR FLEXPEN) 100 UNIT/ML Pen Inject 50 Units into the skin 2 (two) times daily.    [provider]  ondansetron (ZOFRAN) 4 MG tablet Take 1 tablet (4 mg total) by mouth daily as needed for nausea or vomiting. 11/18/14   Schaevitz, Randall An, MD  oxyCODONE-acetaminophen (ROXICET) 5-325 MG per tablet Take 1-2 tablets by mouth every 6 (six) hours as needed. 11/18/14   Orbie Pyo, MD  rosuvastatin (CRESTOR) 40 MG tablet Take 40 mg by mouth daily.    [provider]    Allergies Lisinopril  Family History  Problem Relation Age of Onset  . Heart disease Mother   . Cancer Mother        BREAST CANCER     Social History Social History   Tobacco Use  . Smoking status: Never Smoker  . Smokeless tobacco: Never Used  Substance Use Topics  . Alcohol use: No    Alcohol/week: 0.0 oz  . Drug use: No    Review of Systems  Constitutional: No fever/chills.  Positive for generalized weakness. Eyes: No visual changes. ENT: No sore throat. Cardiovascular: Denies chest pain.  Respiratory: Positive for shortness of breath. Gastrointestinal: Positive for nausea and vomiting.  Genitourinary: Negative for dysuria or frequency.  Musculoskeletal: Negative for back pain. Skin: Negative for rash. Neurological: Negative for headaches, focal weakness or numbness.   ____________________________________________   PHYSICAL EXAM:  VITAL SIGNS: ED Triage Vitals  Enc Vitals Group     BP 05/07/17 1420 (!) 147/60     Pulse Rate 05/07/17 1417 75     Resp 05/07/17 1417 18     Temp 05/07/17 1417 98.4 F (36.9  C)     Temp Source 05/07/17 1417 Oral     SpO2 05/07/17 1417 95 %     Weight 05/07/17 1419 (!) 409 lb (185.5 kg)     Height 05/07/17 1419 5' 10"  (1.778 m)     Head Circumference --      Peak Flow --      Pain Score 05/07/17 1417 10     Pain Loc --      Pain Edu? --      Excl. in [place]? --     Constitutional: Alert and oriented.  Obese.  Relatively well appearing and in no acute distress. Eyes: Conjunctivae are normal.  EOMI. Head: Atraumatic. Nose: No congestion/rhinnorhea. Mouth/Throat: Mucous membranes are slightly dry.   Neck: Normal range of motion.  Cardiovascular: Normal rate, regular rhythm. Grossly normal heart sounds.  Good peripheral circulation. Respiratory: Normal respiratory effort.  No retractions. Lungs CTAB. Gastrointestinal: Soft with mild left upper quadrant tenderness.  Slightly distended. Genitourinary: No CVA tenderness. Musculoskeletal: No lower extremity edema.  Extremities warm and well perfused.  Neurologic:  Normal speech and language. No gross focal neurologic deficits are appreciated.  Skin:  Skin is warm and dry. No rash noted. Psychiatric: Mood and affect are normal. Speech and behavior are normal.  ____________________________________________   LABS (all labs ordered are listed, but only abnormal results are displayed)  Labs Reviewed  BASIC METABOLIC PANEL - Abnormal; Notable for the following components:      Result Value   Glucose, Bld 132 (*)    All other components within normal limits  CBC - Abnormal; Notable for the following components:   RDW 15.3 (*)    All other components within normal limits  HEPATIC FUNCTION PANEL - Abnormal; Notable for the following components:   Bilirubin, Direct <0.1 (*)    All other components within normal limits  URINALYSIS, COMPLETE (UACMP) WITH MICROSCOPIC - Abnormal; Notable for the following components:   Color, Urine YELLOW (*)    APPearance CLEAR (*)    Hgb urine dipstick SMALL (*)    Squamous  Epithelial / LPF 0-5 (*)    All other components within normal limits  TROPONIN I  BRAIN NATRIURETIC PEPTIDE  LIPASE, BLOOD  INFLUENZA PANEL BY PCR (TYPE A & B)  PREGNANCY, URINE   ____________________________________________  EKG  ED ECG REPORT I, Arta Silence, the attending physician, personally viewed and interpreted this ECG.  Date: 05/07/2017 EKG Time: 1415 Rate: 80 Rhythm: normal sinus rhythm QRS Axis: normal Intervals: normal ST/T Wave abnormalities: normal Narrative Interpretation: no evidence of acute ischemia; no significant change when compared to EKG of 11/19/2014  ____________________________________________  RADIOLOGY  CT abd: minimal R pleural effusion; L ovarian cyst CXR: No acute findings  ____________________________________________   PROCEDURES  Procedure(s) performed: No    Critical Care performed: No ____________________________________________   INITIAL IMPRESSION / ASSESSMENT AND PLAN / ED COURSE  Pertinent labs & imaging results that were available during my  care of the patient were reviewed by me and considered in my medical decision making (see chart for details).  51 year old female with past medical history as noted above presents primarily with left upper quadrant abdominal pain associated with nausea and vomiting, abdominal distention, and decreased bowel movements in the last several days.  Patient reports that the pain does radiate somewhat to the chest, and she has some shortness of breath associated with it.  Patient also reports generalized weakness and malaise.  Review of past medical records in Epic is noncontributory, except for a visit last year for chest pain shortness of breath in which patient was ruled out for MI.  On exam, patient has normal vital signs except for slight hypertension, she is relatively comfortable appearing and the remainder the exam is as described with some abdominal tenderness.  Patient has  apparently had an attempted gastric bypass but the surgery was aborted; she has several small scars, but I am not sure based on patient's history whether the peritoneum was violated during this procedure.  Differential includes gastritis, PUD, GERD, less likely SBO or volvulus, or hepatobiliary cause.  Also consider UTI, flu/viral syndrome.  I have a low suspicion for ACS or other cardiac cause.  Given patient's increased risk will obtain troponin chest x-ray, but the pain appears to be originating in the abdomen.    ----------------------------------------- 7:19 PM on 05/07/2017 -----------------------------------------  Patient reports significantly improved pain, and she appears very comfortable.  Her vital signs are stable.  Her workup is largely negative.  Chest x-ray had no acute findings, UA is negative, flu is negative, and the remainder of the labs are unremarkable.  Her CT also does not show any significant findings.  She has a minimal right pleural effusion which is asymptomatic and likely chronic, and a left ovarian cyst which is not anywhere near where her pain has been.  Patient now more clearly describes a sensation of a sour taste and pain radiating up into her chest, so I suspect that the most likely etiology of her symptoms is GERD, gastritis, PUD, or gastroparesis related to her diabetes.  She also likely has simple constipation, this may be contributing to some of her left upper quadrant pain.  She feels well to go home, and there is no indication for further ED workup or observation at this time.  I will give Reglan and Pepcid for symptomatic treatment, and I will discharge the patient with a prescription for magnesium citrate for her constipation.  Return precautions given and patient expressed understanding.  She feels comfortable with the discharge plan. ____________________________________________   FINAL CLINICAL IMPRESSION(S) / ED DIAGNOSES  Final diagnoses:  Left upper  quadrant pain  Gastroesophageal reflux disease, esophagitis presence not specified      NEW MEDICATIONS STARTED DURING THIS VISIT:  This SmartLink is deprecated. Use AVSMEDLIST instead to display the medication list for a patient.   Note:  This document was prepared using Dragon voice recognition software and may include unintentional dictation errors.    Arta Silence, MD 05/07/17 1921

## 2017-05-14 ENCOUNTER — Ambulatory Visit: Payer: BLUE CROSS/BLUE SHIELD

## 2017-08-29 DIAGNOSIS — K219 Gastro-esophageal reflux disease without esophagitis: Secondary | ICD-10-CM | POA: Insufficient documentation

## 2017-09-17 ENCOUNTER — Ambulatory Visit: Payer: Medicare HMO | Admitting: Psychology

## 2017-09-17 DIAGNOSIS — F509 Eating disorder, unspecified: Secondary | ICD-10-CM

## 2017-09-24 ENCOUNTER — Ambulatory Visit: Payer: Medicare HMO | Admitting: Psychology

## 2017-09-24 DIAGNOSIS — F509 Eating disorder, unspecified: Secondary | ICD-10-CM

## 2017-10-08 ENCOUNTER — Other Ambulatory Visit: Payer: Self-pay

## 2017-10-08 ENCOUNTER — Ambulatory Visit: Payer: Medicare HMO | Admitting: Psychiatry

## 2017-10-08 ENCOUNTER — Encounter: Payer: Self-pay | Admitting: Psychiatry

## 2017-10-08 VITALS — BP 151/85 | HR 88 | Temp 98.1°F

## 2017-10-08 DIAGNOSIS — F330 Major depressive disorder, recurrent, mild: Secondary | ICD-10-CM | POA: Diagnosis not present

## 2017-10-08 DIAGNOSIS — F329 Major depressive disorder, single episode, unspecified: Secondary | ICD-10-CM | POA: Insufficient documentation

## 2017-10-08 DIAGNOSIS — J45909 Unspecified asthma, uncomplicated: Secondary | ICD-10-CM | POA: Insufficient documentation

## 2017-10-08 DIAGNOSIS — F32A Depression, unspecified: Secondary | ICD-10-CM | POA: Insufficient documentation

## 2017-10-08 DIAGNOSIS — Z6841 Body Mass Index (BMI) 40.0 and over, adult: Secondary | ICD-10-CM

## 2017-10-08 DIAGNOSIS — E559 Vitamin D deficiency, unspecified: Secondary | ICD-10-CM | POA: Insufficient documentation

## 2017-10-08 DIAGNOSIS — R809 Proteinuria, unspecified: Secondary | ICD-10-CM | POA: Insufficient documentation

## 2017-10-08 DIAGNOSIS — E1129 Type 2 diabetes mellitus with other diabetic kidney complication: Secondary | ICD-10-CM | POA: Insufficient documentation

## 2017-10-08 DIAGNOSIS — F5105 Insomnia due to other mental disorder: Secondary | ICD-10-CM

## 2017-10-08 DIAGNOSIS — F419 Anxiety disorder, unspecified: Secondary | ICD-10-CM | POA: Diagnosis not present

## 2017-10-08 DIAGNOSIS — N926 Irregular menstruation, unspecified: Secondary | ICD-10-CM | POA: Insufficient documentation

## 2017-10-08 DIAGNOSIS — E785 Hyperlipidemia, unspecified: Secondary | ICD-10-CM | POA: Insufficient documentation

## 2017-10-08 DIAGNOSIS — E1140 Type 2 diabetes mellitus with diabetic neuropathy, unspecified: Secondary | ICD-10-CM | POA: Insufficient documentation

## 2017-10-08 DIAGNOSIS — J309 Allergic rhinitis, unspecified: Secondary | ICD-10-CM | POA: Insufficient documentation

## 2017-10-08 MED ORDER — TRAZODONE HCL 50 MG PO TABS: 50.0000 mg | ORAL_TABLET | Freq: Every evening | ORAL | 1 refills | Status: DC | PRN

## 2017-10-08 MED ORDER — LAMOTRIGINE 25 MG PO TABS: 25.0000 mg | ORAL_TABLET | Freq: Every day | ORAL | 1 refills | Status: DC

## 2017-10-08 MED ORDER — CITALOPRAM HYDROBROMIDE 40 MG PO TABS: 40.0000 mg | ORAL_TABLET | Freq: Every day | ORAL | 1 refills | Status: AC
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
## 2017-10-08 NOTE — Patient Instructions (Signed)
Trazodone tablets What is this medicine? TRAZODONE (TRAZ oh done) is used to treat depression. This medicine may be used for other purposes; ask your health care provider or pharmacist if you have questions. COMMON BRAND NAME(S): Desyrel What should I tell my health care provider before I take this medicine? They need to know if you have any of these conditions: -attempted suicide or thinking about it -bipolar disorder -bleeding problems -glaucoma -heart disease, or previous heart attack -irregular heart beat -kidney or liver disease -low levels of sodium in the blood -an unusual or allergic reaction to trazodone, other medicines, foods, dyes or preservatives -pregnant or trying to get pregnant -breast-feeding How should I use this medicine? Take this medicine by mouth with a glass of water. Follow the directions on the prescription label. Take this medicine shortly after a meal or a light snack. Take your medicine at regular intervals. Do not take your medicine more often than directed. Do not stop taking this medicine suddenly except upon the advice of your doctor. Stopping this medicine too quickly may cause serious side effects or your condition may worsen. A special MedGuide will be given to you by the pharmacist with each prescription and refill. Be sure to read this information carefully each time. Talk to your pediatrician regarding the use of this medicine in children. Special care may be needed. Overdosage: If you think you have taken too much of this medicine contact a poison control center or emergency room at once. NOTE: This medicine is only for you. Do not share this medicine with others. What if I miss a dose? If you miss a dose, take it as soon as you can. If it is almost time for your next dose, take only that dose. Do not take double or extra doses. What may interact with this medicine? Do not take this medicine with any of the following medications: -certain medicines  for fungal infections like fluconazole, itraconazole, ketoconazole, posaconazole, voriconazole -cisapride -dofetilide -dronedarone -linezolid -MAOIs like Carbex, Eldepryl, Marplan, Nardil, and Parnate -mesoridazine -methylene blue (injected into a vein) -pimozide -saquinavir -thioridazine -ziprasidone This medicine may also interact with the following medications: -alcohol -antiviral medicines for HIV or AIDS -aspirin and aspirin-like medicines -barbiturates like phenobarbital -certain medicines for blood pressure, heart disease, irregular heart beat -certain medicines for depression, anxiety, or psychotic disturbances -certain medicines for migraine headache like almotriptan, eletriptan, frovatriptan, naratriptan, rizatriptan, sumatriptan, zolmitriptan -certain medicines for seizures like carbamazepine and phenytoin -certain medicines for sleep -certain medicines that treat or prevent blood clots like dalteparin, enoxaparin, warfarin -digoxin -fentanyl -lithium -NSAIDS, medicines for pain and inflammation, like ibuprofen or naproxen -other medicines that prolong the QT interval (cause an abnormal heart rhythm) -rasagiline -supplements like St. John's wort, kava kava, valerian -tramadol -tryptophan This list may not describe all possible interactions. Give your health care provider a list of all the medicines, herbs, non-prescription drugs, or dietary supplements you use. Also tell them if you smoke, drink alcohol, or use illegal drugs. Some items may interact with your medicine. What should I watch for while using this medicine? Tell your doctor if your symptoms do not get better or if they get worse. Visit your doctor or health care professional for regular checks on your progress. Because it may take several weeks to see the full effects of this medicine, it is important to continue your treatment as prescribed by your doctor. Patients and their families should watch out for new  or worsening thoughts of suicide or depression. Also  watch out for sudden changes in feelings such as feeling anxious, agitated, panicky, irritable, hostile, aggressive, impulsive, severely restless, overly excited and hyperactive, or not being able to sleep. If this happens, especially at the beginning of treatment or after a change in dose, call your health care professional. Dennis Bast may get drowsy or dizzy. Do not drive, use machinery, or do anything that needs mental alertness until you know how this medicine affects you. Do not stand or sit up quickly, especially if you are an older patient. This reduces the risk of dizzy or fainting spells. Alcohol may interfere with the effect of this medicine. Avoid alcoholic drinks. This medicine may cause dry eyes and blurred vision. If you wear contact lenses you may feel some discomfort. Lubricating drops may help. See your eye doctor if the problem does not go away or is severe. Your mouth may get dry. Chewing sugarless gum, sucking hard candy and drinking plenty of water may help. Contact your doctor if the problem does not go away or is severe. What side effects may I notice from receiving this medicine? Side effects that you should report to your doctor or health care professional as soon as possible: -allergic reactions like skin rash, itching or hives, swelling of the face, lips, or tongue -elevated mood, decreased need for sleep, racing thoughts, impulsive behavior -confusion -fast, irregular heartbeat -feeling faint or lightheaded, falls -feeling agitated, angry, or irritable -loss of balance or coordination -painful or prolonged erections -restlessness, pacing, inability to keep still -suicidal thoughts or other mood changes -tremors -trouble sleeping -seizures -unusual bleeding or bruising Side effects that usually do not require medical attention (report to your doctor or health care professional if they continue or are bothersome): -change in  sex drive or performance -change in appetite or weight -constipation -headache -muscle aches or pains -nausea This list may not describe all possible side effects. Call your doctor for medical advice about side effects. You may report side effects to FDA at 1-800-FDA-1088. Where should I keep my medicine? Keep out of the reach of children. Store at room temperature between 15 and 30 degrees C (59 to 86 degrees F). Protect from light. Keep container tightly closed. Throw away any unused medicine after the expiration date. NOTE: This sheet is a summary. It may not cover all possible information. If you have questions about this medicine, talk to your doctor, pharmacist, or health care provider.  2018 [employer]/Gold Standard (2015-10-28 16:57:05) Lamotrigine tablets What is this medicine? LAMOTRIGINE (la MOE Hendricks Limes) is used to control seizures in adults and children with epilepsy and Lennox-Gastaut syndrome. It is also used in adults to treat bipolar disorder. This medicine may be used for other purposes; ask your health care provider or pharmacist if you have questions. COMMON BRAND NAME(S): Lamictal What should I tell my health care provider before I take this medicine? They need to know if you have any of these conditions: -a history of depression or bipolar disorder -aseptic meningitis during prior use of lamotrigine -folate deficiency -kidney disease -liver disease -suicidal thoughts, plans, or attempt; a previous suicide attempt by you or a family member -an unusual or allergic reaction to lamotrigine or other seizure medications, other medicines, foods, dyes, or preservatives -pregnant or trying to get pregnant -breast-feeding How should I use this medicine? Take this medicine by mouth with a glass of water. Follow the directions on the prescription label. Do not chew these tablets. If this medicine upsets your stomach, take it with food  or milk. Take your doses at regular  intervals. Do not take your medicine more often than directed. A special MedGuide will be given to you by the pharmacist with each new prescription and refill. Be sure to read this information carefully each time. Talk to your pediatrician regarding the use of this medicine in children. While this drug may be prescribed for children as young as 2 years for selected conditions, precautions do apply. Overdosage: If you think you have taken too much of this medicine contact a poison control center or emergency room at once. NOTE: This medicine is only for you. Do not share this medicine with others. What if I miss a dose? If you miss a dose, take it as soon as you can. If it is almost time for your next dose, take only that dose. Do not take double or extra doses. What may interact with this medicine? -carbamazepine -female hormones, including contraceptive or birth control pills -methotrexate -phenobarbital -phenytoin -primidone -pyrimethamine -rifampin -trimethoprim -valproic acid This list may not describe all possible interactions. Give your health care provider a list of all the medicines, herbs, non-prescription drugs, or dietary supplements you use. Also tell them if you smoke, drink alcohol, or use illegal drugs. Some items may interact with your medicine. What should I watch for while using this medicine? Visit your doctor or health care professional for regular checks on your progress. If you take this medicine for seizures, wear a Medic Alert bracelet or necklace. Carry an identification card with information about your condition, medicines, and doctor or health care professional. It is important to take this medicine exactly as directed. When first starting treatment, your dose will need to be adjusted slowly. It may take weeks or months before your dose is stable. You should contact your doctor or health care professional if your seizures get worse or if you have any new types of  seizures. Do not stop taking this medicine unless instructed by your doctor or health care professional. Stopping your medicine suddenly can increase your seizures or their severity. Contact your doctor or health care professional right away if you develop a rash while taking this medicine. Rashes may be very severe and sometimes require treatment in the hospital. Deaths from rashes have occurred. Serious rashes occur more often in children than adults taking this medicine. It is more common for these serious rashes to occur during the first 2 months of treatment, but a rash can occur at any time. You may get drowsy, dizzy, or have blurred vision. Do not drive, use machinery, or do anything that needs mental alertness until you know how this medicine affects you. To reduce dizzy or fainting spells, do not sit or stand up quickly, especially if you are an older patient. Alcohol can increase drowsiness and dizziness. Avoid alcoholic drinks. If you are taking this medicine for bipolar disorder, it is important to report any changes in your mood to your doctor or health care professional. If your condition gets worse, you get mentally depressed, feel very hyperactive or manic, have difficulty sleeping, or have thoughts of hurting yourself or committing suicide, you need to get help from your health care professional right away. If you are a caregiver for someone taking this medicine for bipolar disorder, you should also report these behavioral changes right away. The use of this medicine may increase the chance of suicidal thoughts or actions. Pay special attention to how you are responding while on this medicine. Your mouth may get  dry. Chewing sugarless gum or sucking hard candy, and drinking plenty of water may help. Contact your doctor if the problem does not go away or is severe. Women who become pregnant while using this medicine may enroll in the [place] Pregnancy Registry by  calling [phone]. This registry collects information about the safety of antiepileptic drug use during pregnancy. What side effects may I notice from receiving this medicine? Side effects that you should report to your doctor or health care professional as soon as possible: -allergic reactions like skin rash, itching or hives, swelling of the face, lips, or tongue -blurred or double vision -difficulty walking or controlling muscle movements -fever -headache, stiff neck, and sensitivity to light -painful sores in the mouth, eyes, or nose -redness, blistering, peeling or loosening of the skin, including inside the mouth -severe muscle pain -swollen lymph glands -uncontrollable eye movements -unusual bruising or bleeding -unusually weak or tired -vomiting -worsening of mood, thoughts or actions of suicide or dying -yellowing of the eyes or skin Side effects that usually do not require medical attention (report to your doctor or health care professional if they continue or are bothersome): -diarrhea or constipation -difficulty sleeping -nausea -tremors This list may not describe all possible side effects. Call your doctor for medical advice about side effects. You may report side effects to FDA at 1-800-FDA-1088. Where should I keep my medicine? Keep out of reach of children. Store at room temperature between 15 and 30 degrees C (59 and 86 degrees F). Throw away any unused medicine after the expiration date. NOTE: This sheet is a summary. It may not cover all possible information. If you have questions about this medicine, talk to your doctor, pharmacist, or health care provider.  2018 [employer]vier/Gold Standard (2015-07-01 09:29:40)

## 2017-10-24 ENCOUNTER — Telehealth: Payer: Self-pay

## 2017-10-24 NOTE — Telephone Encounter (Signed)
pt was called pt states she stop taking has not taken today and she made appt for tomorrow at 4:00 pt was advised if rash got any worse or any difficulity breathing to go to er. Pt stated that she started takin benadryl

## 2017-10-24 NOTE — Telephone Encounter (Signed)
pt called left message that she has a rash on her legs and rt shoulder pt last seen on  10-08-17 and next appt 11-07-17

## 2017-10-24 NOTE — Telephone Encounter (Signed)
ok 

## 2017-10-25 ENCOUNTER — Ambulatory Visit: Payer: Medicare HMO | Admitting: Psychiatry

## 2017-11-07 ENCOUNTER — Ambulatory Visit: Payer: Medicare HMO | Admitting: Psychiatry

## 2018-03-18 ENCOUNTER — Other Ambulatory Visit: Payer: Self-pay | Admitting: Nurse Practitioner

## 2018-03-18 DIAGNOSIS — Z1231 Encounter for screening mammogram for malignant neoplasm of breast: Secondary | ICD-10-CM

## 2018-08-02 ENCOUNTER — Telehealth (HOSPITAL_COMMUNITY): Payer: Self-pay

## 2018-08-02 ENCOUNTER — Other Ambulatory Visit: Payer: Self-pay | Admitting: Specialist

## 2018-08-02 ENCOUNTER — Other Ambulatory Visit (HOSPITAL_COMMUNITY): Payer: Self-pay | Admitting: Specialist

## 2018-08-02 DIAGNOSIS — R0609 Other forms of dyspnea: Secondary | ICD-10-CM

## 2018-08-02 DIAGNOSIS — J986 Disorders of diaphragm: Secondary | ICD-10-CM

## 2018-08-09 ENCOUNTER — Ambulatory Visit
Admission: RE | Admit: 2018-08-09 | Discharge: 2018-08-09 | Disposition: A | Discharge status: Home / Self Care | Payer: Medicare HMO | Source: Ambulatory Visit | Attending: Specialist | Admitting: Specialist

## 2018-08-09 DIAGNOSIS — R0609 Other forms of dyspnea: Secondary | ICD-10-CM

## 2018-08-09 DIAGNOSIS — J986 Disorders of diaphragm: Secondary | ICD-10-CM | POA: Insufficient documentation

## 2018-10-09 ENCOUNTER — Other Ambulatory Visit: Payer: Self-pay

## 2018-10-09 ENCOUNTER — Emergency Department: Payer: No Typology Code available for payment source

## 2018-10-09 ENCOUNTER — Emergency Department
Admission: EM | Admit: 2018-10-09 | Discharge: 2018-10-09 | Disposition: A | Discharge status: Home / Self Care | Payer: No Typology Code available for payment source | Attending: Emergency Medicine | Admitting: Emergency Medicine

## 2018-10-09 DIAGNOSIS — S8001XA Contusion of right knee, initial encounter: Secondary | ICD-10-CM | POA: Diagnosis not present

## 2018-10-09 DIAGNOSIS — I2510 Atherosclerotic heart disease of native coronary artery without angina pectoris: Secondary | ICD-10-CM | POA: Insufficient documentation

## 2018-10-09 DIAGNOSIS — E119 Type 2 diabetes mellitus without complications: Secondary | ICD-10-CM | POA: Insufficient documentation

## 2018-10-09 DIAGNOSIS — I10 Essential (primary) hypertension: Secondary | ICD-10-CM | POA: Insufficient documentation

## 2018-10-09 DIAGNOSIS — Z794 Long term (current) use of insulin: Secondary | ICD-10-CM | POA: Insufficient documentation

## 2018-10-09 DIAGNOSIS — S199XXA Unspecified injury of neck, initial encounter: Secondary | ICD-10-CM | POA: Diagnosis present

## 2018-10-09 DIAGNOSIS — S161XXA Strain of muscle, fascia and tendon at neck level, initial encounter: Secondary | ICD-10-CM | POA: Insufficient documentation

## 2018-10-09 DIAGNOSIS — Y999 Unspecified external cause status: Secondary | ICD-10-CM | POA: Insufficient documentation

## 2018-10-09 DIAGNOSIS — S8011XA Contusion of right lower leg, initial encounter: Secondary | ICD-10-CM

## 2018-10-09 DIAGNOSIS — Y92410 Unspecified street and highway as the place of occurrence of the external cause: Secondary | ICD-10-CM | POA: Diagnosis not present

## 2018-10-09 DIAGNOSIS — Y9389 Activity, other specified: Secondary | ICD-10-CM | POA: Insufficient documentation

## 2018-10-09 DIAGNOSIS — S7001XA Contusion of right hip, initial encounter: Secondary | ICD-10-CM | POA: Insufficient documentation

## 2018-10-09 DIAGNOSIS — Z79899 Other long term (current) drug therapy: Secondary | ICD-10-CM | POA: Diagnosis not present

## 2018-10-09 MED ORDER — METHOCARBAMOL 500 MG PO TABS: 500.0000 mg | ORAL_TABLET | Freq: Four times a day (QID) | ORAL | 0 refills | Status: DC

## 2018-10-09 MED ORDER — METHOCARBAMOL 500 MG PO TABS
1000.0000 mg | ORAL_TABLET | Freq: Once | ORAL | Status: AC
  Administered 2018-10-09: 22:00:00 1000 mg via ORAL
  Filled 2018-10-09: qty 2

## 2018-10-09 MED ORDER — MELOXICAM 15 MG PO TABS: 15.0000 mg | ORAL_TABLET | Freq: Every day | ORAL | 0 refills | Status: DC

## 2018-10-09 MED ORDER — MELOXICAM 7.5 MG PO TABS
15.0000 mg | ORAL_TABLET | Freq: Once | ORAL | Status: AC
  Administered 2018-10-09: 15 mg via ORAL
  Filled 2018-10-09: qty 2

## 2018-10-09 NOTE — ED Notes (Signed)
Patient attempted to sign but computer froze
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
## 2018-10-13 IMAGING — CT CT ABD-PELV W/ CM
2 of 5 series · 17 of 46 positions shown, 19 images · IV contrast (iopamidol)
Comparison: None.

CLINICAL DATA: Acute epigastric abdominal pain.

EXAM:
CT ABDOMEN AND PELVIS WITH CONTRAST
TECHNIQUE: Multidetector CT imaging of the abdomen and pelvis was performed
using the standard protocol following bolus administration of
intravenous contrast.
CONTRAST:  125mL A9JFZQ-QUU IOPAMIDOL (A9JFZQ-[id number]) INJECTION 61%

[Series 2: routine abd/pel with · axial · 0.93mm/px · z∈[-928,-443]mm · 14 of 109 slices shown, 16 images]
[im 6/109  soft-tissue]
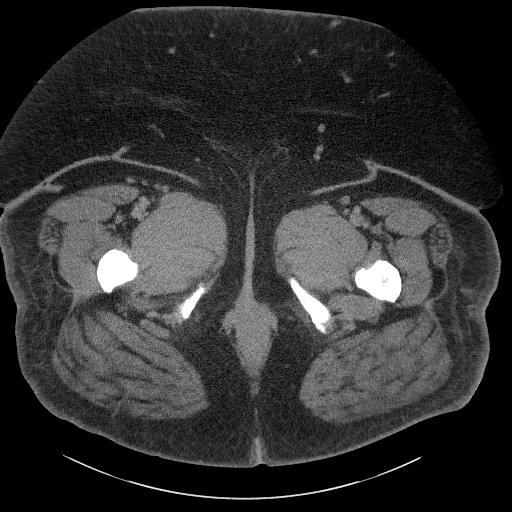
[im 6/109  bone]
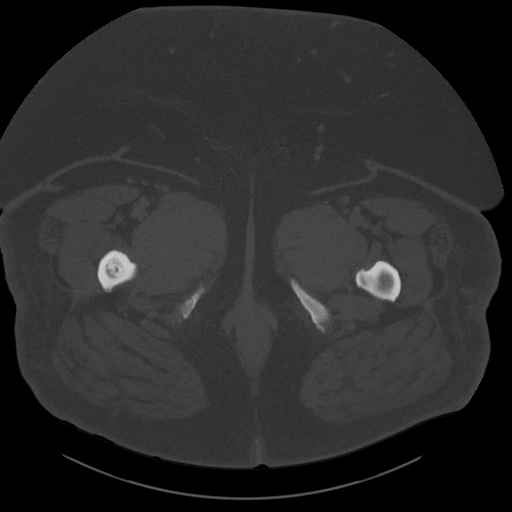
[im 12/109  soft-tissue]
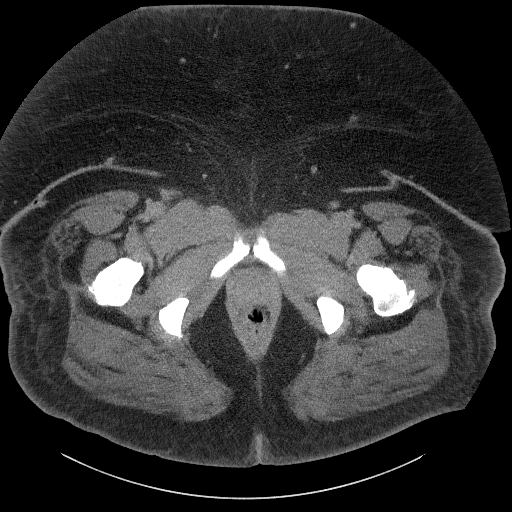
[im 23/109  soft-tissue]
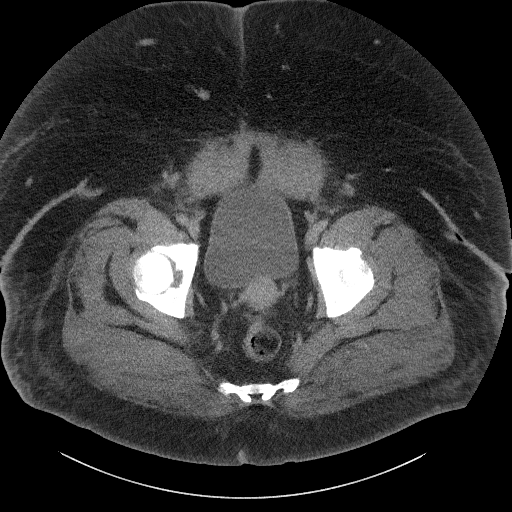
[im 29/109  soft-tissue]
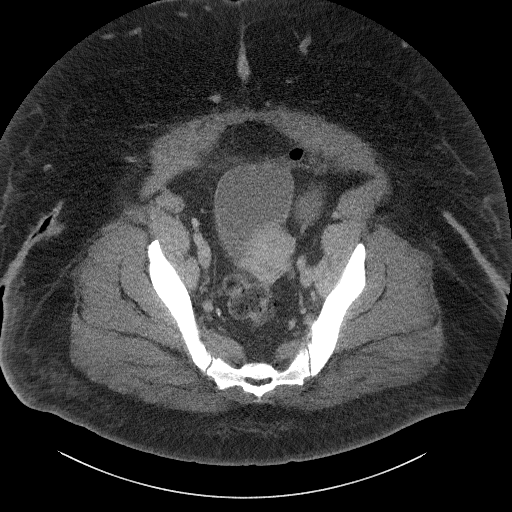
[im 35/109  soft-tissue]
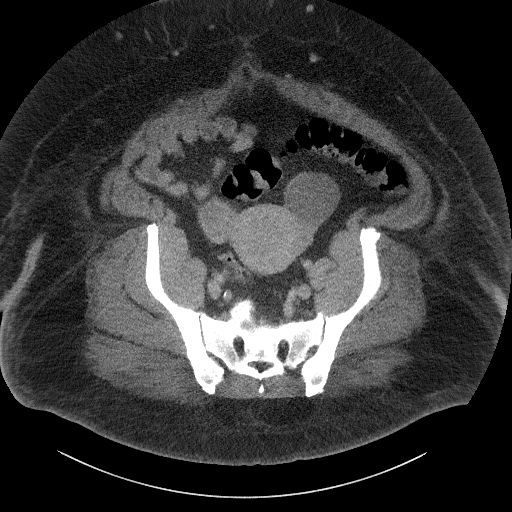
[im 46/109  soft-tissue]
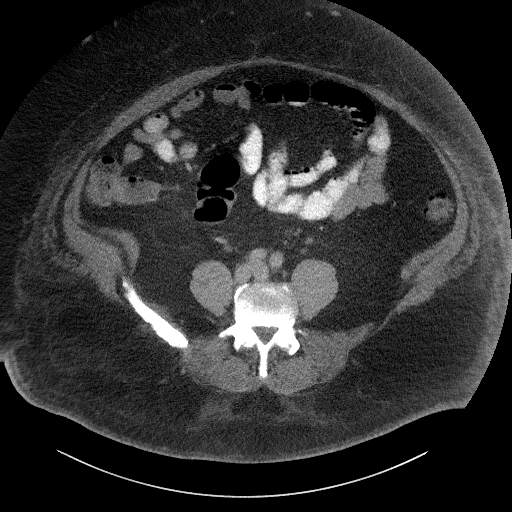
[im 52/109  soft-tissue]
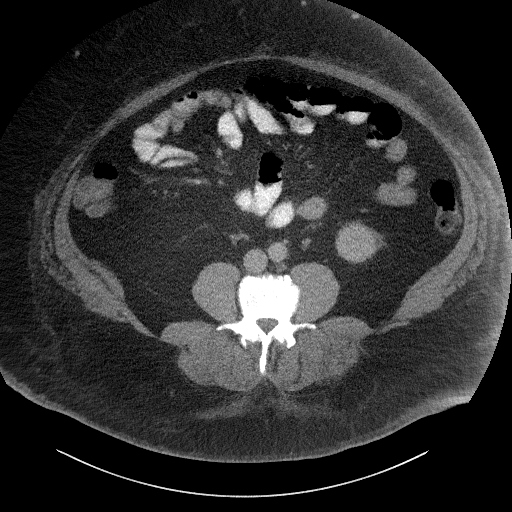
[im 57/109  soft-tissue]
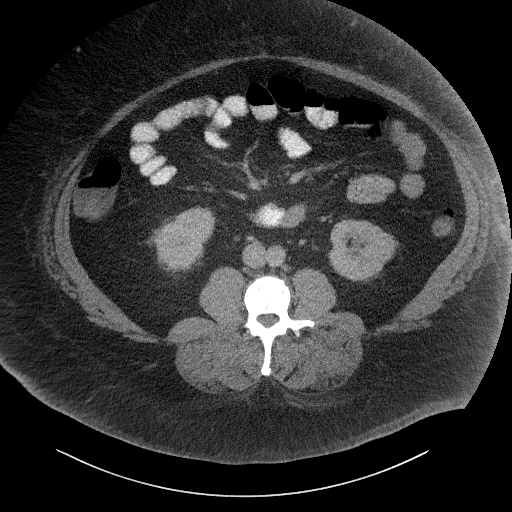
[im 63/109  soft-tissue]
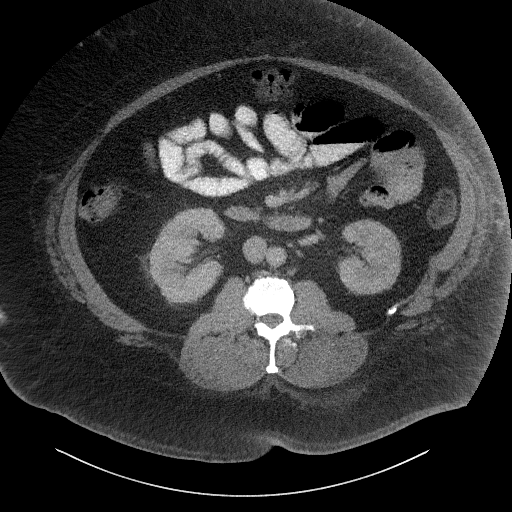
[im 63/109  bone]
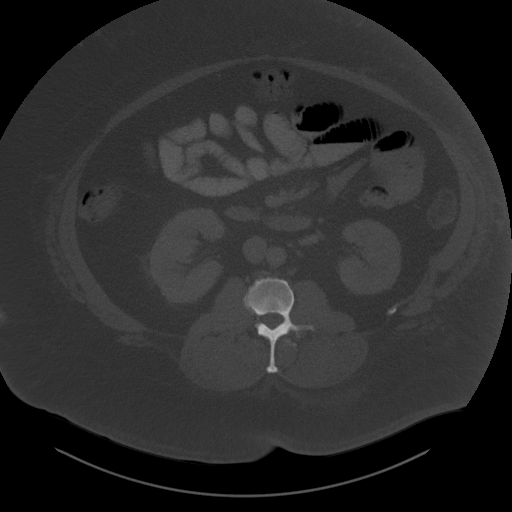
[im 74/109  soft-tissue]
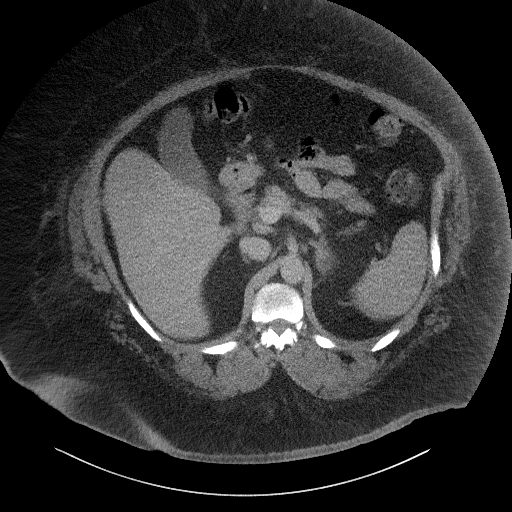
[im 80/109  soft-tissue]
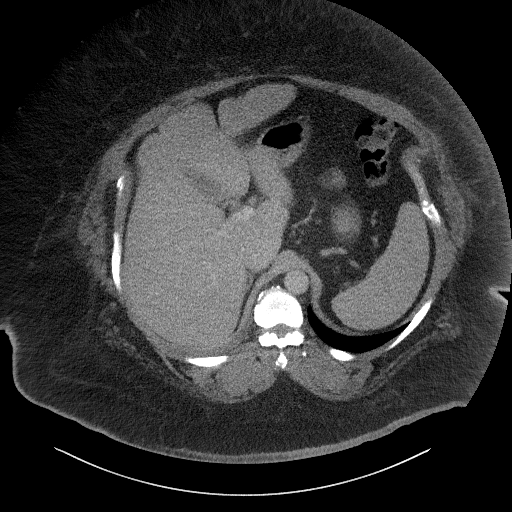
[im 86/109  soft-tissue]
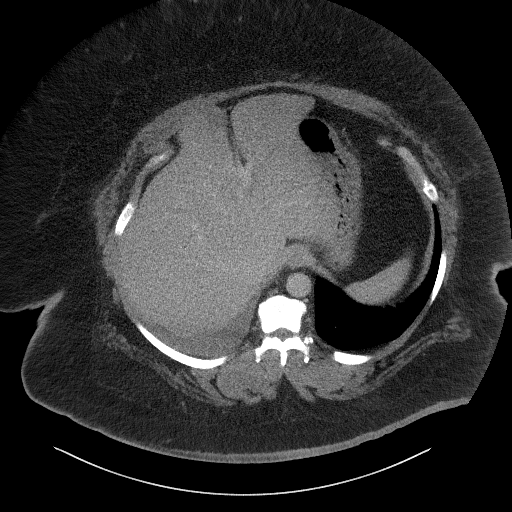
[im 97/109  soft-tissue]
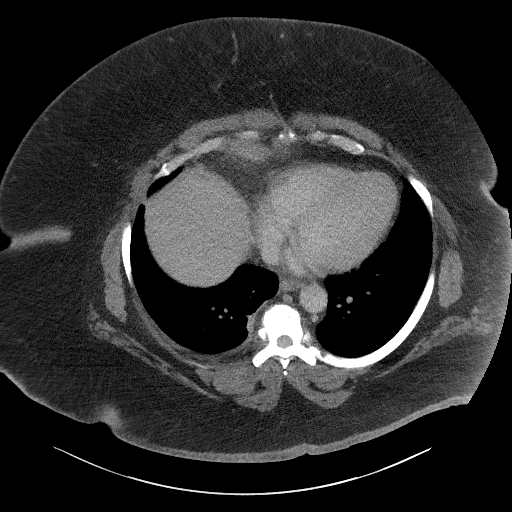
[im 103/109  soft-tissue]
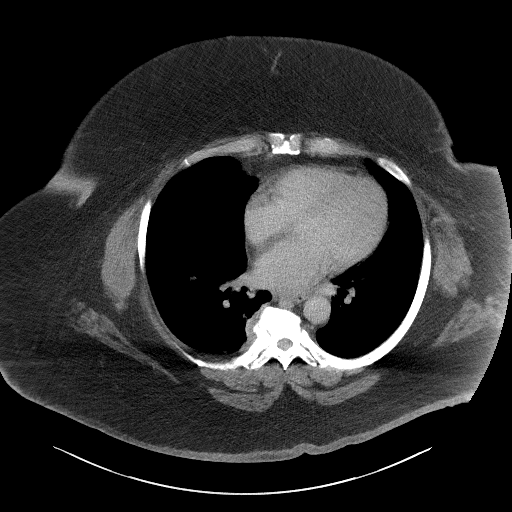

[Series 5: coronal st · coronal · 1.05mm/px · 3 of 105 slices shown]
[im 35/105  soft-tissue]
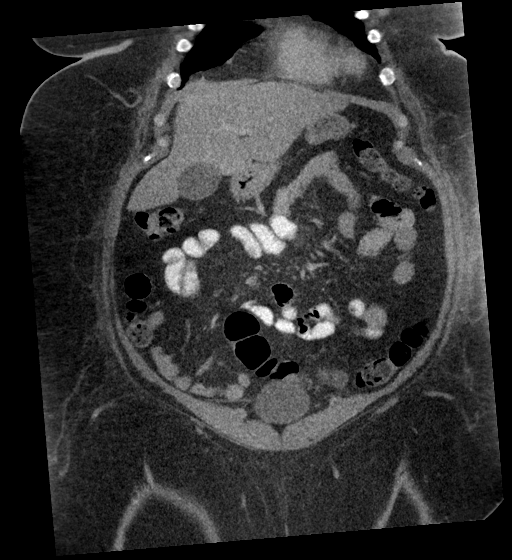
[im 47/105  soft-tissue]
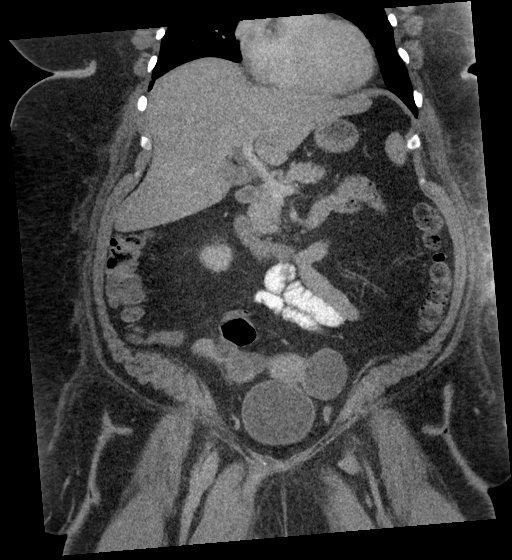
[im 58/105  soft-tissue]
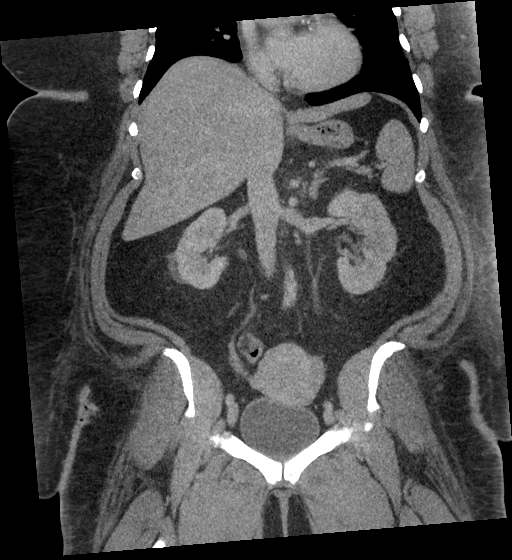

[17 of 46 positions shown; findings below may reference images not displayed]

FINDINGS: Lower chest: Minimal right pleural effusion is noted with adjacent
subsegmental atelectasis.

Hepatobiliary: No focal liver abnormality is seen. No gallstones,
gallbladder wall thickening, or biliary dilatation.

Pancreas: Unremarkable. No pancreatic ductal dilatation or
surrounding inflammatory changes.

Spleen: Normal in size without focal abnormality.

Adrenals/Urinary Tract: Right adrenal gland appears normal. 3 cm fat
containing left adrenal myelolipoma is noted. No hydronephrosis or
renal obstruction is noted. No renal or ureteral calculi are noted.
Urinary bladder is unremarkable.

Stomach/Bowel: Stomach is within normal limits. Appendix appears
normal. No evidence of bowel wall thickening, distention, or
inflammatory changes.

Vascular/Lymphatic: No significant vascular findings are present. No
enlarged abdominal or pelvic lymph nodes.

Reproductive: Uterus and right ovary are unremarkable. 5.3 cm left
ovarian cyst is noted.

Other: No abdominal wall hernia or abnormality. No abdominopelvic
ascites.

Musculoskeletal: No acute or significant osseous findings.
IMPRESSION: Minimal right pleural effusion with adjacent subsegmental
atelectasis.

3 cm left adrenal myelolipoma.

5.3 cm left ovarian cyst. Pelvic ultrasound is recommended for
further evaluation.

## 2019-01-06 ENCOUNTER — Telehealth: Payer: Medicare HMO | Admitting: Family

## 2019-01-06 DIAGNOSIS — A499 Bacterial infection, unspecified: Secondary | ICD-10-CM

## 2019-01-06 DIAGNOSIS — N390 Urinary tract infection, site not specified: Secondary | ICD-10-CM | POA: Diagnosis not present

## 2019-01-06 MED ORDER — NITROFURANTOIN MONOHYD MACRO 100 MG PO CAPS: 100.0000 mg | ORAL_CAPSULE | Freq: Two times a day (BID) | ORAL | 0 refills | Status: DC

## 2019-01-06 NOTE — Progress Notes (Signed)
We are sorry that you are not feeling well.  Here is how we plan to help!  Based on what you shared with me it looks like you most likely have a simple urinary tract infection.  A UTI (Urinary Tract Infection) is a bacterial infection of the bladder.  Most cases of urinary tract infections are simple to treat but a key part of your care is to encourage you to drink plenty of fluids and watch your symptoms carefully.  I have prescribed MacroBid 100 mg twice a day for 5 days.  Your symptoms should gradually improve. Call us if the burning in your urine worsens, you develop worsening fever, back pain or pelvic pain or if your symptoms do not resolve after completing the antibiotic.  Urinary tract infections can be prevented by drinking plenty of water to keep your body hydrated.  Also be sure when you wipe, wipe from front to back and don't hold it in!  If possible, empty your bladder every 4 hours.  Your e-visit answers were reviewed by a board certified advanced clinical practitioner to complete your personal care plan.  Depending on the condition, your plan could have included both over the counter or prescription medications.  If there is a problem please reply  once you have received a response from your provider.  Your safety is important to [place].  If you have drug allergies check your prescription carefully.    You can use MyChart to ask questions about today's visit, request a non-urgent call back, or ask for a work or school excuse for 24 hours related to this e-Visit. If it has been greater than 24 hours you will need to follow up with your provider, or enter a new e-Visit to address those concerns.   You will get an e-mail in the next two days asking about your experience.  I hope that your e-visit has been valuable and will speed your recovery. Thank you for using e-visits.  Greater than 5 minutes, yet less than 10 minutes of time have been spent researching, coordinating, and  implementing care for this patient today.  Thank you for the details you included in the comment boxes. Those details are very helpful in determining the best course of treatment for you and help [place] to provide the best care.

## 2019-01-31 ENCOUNTER — Telehealth: Payer: Medicare HMO | Admitting: Physician Assistant

## 2019-01-31 DIAGNOSIS — R109 Unspecified abdominal pain: Secondary | ICD-10-CM

## 2019-01-31 NOTE — Progress Notes (Addendum)
Based on what you shared with me, I feel your condition warrants further evaluation and I recommend that you be seen for a face to face office visit.  NOTE: If you entered your credit card information for this eVisit, you will not be charged. You may see a "hold" on your card for the $35 but that hold will drop off and you will not have a charge processed.  If you are having a true medical emergency please call 911.     For an urgent face to face visit, [place] has five urgent care centers for your convenience:   [email or web address] to reserve your spot online an avoid wait times  [place], [place], [place] [address] *Just off [address], across the road from [place] hours of operation: Monday-Friday, 12 PM to 6 PM  Closed Saturday & Sunday    . [facility]    [phone]                  Get Driving Directions  [address], [place] [address] . 10 am to 8 pm Monday-Friday . 12 pm to 8 pm Saturday-Sunday   . [facility] at [place]                  Get Driving Directions  [address], [place], [place] [address] . 8 am to 8 pm Monday-Friday . 9 am to 6 pm Saturday . 11 am to 6 pm Sunday   . [facility] at [place]                  Get Driving Directions   [address], [place] [address] . 8 am to 8 pm Monday-Friday . 8 am to 4 pm Saturday-Sunday    . [facility] at [place]                    Get Driving Directions  [phone]  [address] [place], [place] [address]  . Monday-Friday, 12 PM to 6 PM    Your e-visit answers were reviewed by a board certified advanced clinical practitioner to complete your personal care plan.  Thank you for using e-Visits.  Greater than 5 minutes, yet less than 10 minutes of time have  been spent researching, coordinating, and implementing care for this patient today

## 2019-02-04 ENCOUNTER — Other Ambulatory Visit: Payer: Self-pay | Admitting: Nurse Practitioner

## 2019-02-04 DIAGNOSIS — Z1231 Encounter for screening mammogram for malignant neoplasm of breast: Secondary | ICD-10-CM

## 2019-08-30 ENCOUNTER — Ambulatory Visit: Payer: No Typology Code available for payment source

## 2021-07-18 ENCOUNTER — Other Ambulatory Visit: Payer: Self-pay

## 2021-07-18 ENCOUNTER — Observation Stay
Admission: EM | Admit: 2021-07-18 | Discharge: 2021-07-20 | Disposition: A | Discharge status: Home / Self Care | Payer: Medicare HMO | Attending: Internal Medicine | Admitting: Internal Medicine

## 2021-07-18 ENCOUNTER — Encounter: Payer: Self-pay | Admitting: Emergency Medicine

## 2021-07-18 ENCOUNTER — Emergency Department: Payer: Medicare HMO

## 2021-07-18 DIAGNOSIS — F32A Depression, unspecified: Secondary | ICD-10-CM | POA: Diagnosis present

## 2021-07-18 DIAGNOSIS — I2510 Atherosclerotic heart disease of native coronary artery without angina pectoris: Secondary | ICD-10-CM | POA: Insufficient documentation

## 2021-07-18 DIAGNOSIS — Z7982 Long term (current) use of aspirin: Secondary | ICD-10-CM | POA: Diagnosis not present

## 2021-07-18 DIAGNOSIS — E1122 Type 2 diabetes mellitus with diabetic chronic kidney disease: Secondary | ICD-10-CM | POA: Diagnosis not present

## 2021-07-18 DIAGNOSIS — I129 Hypertensive chronic kidney disease with stage 1 through stage 4 chronic kidney disease, or unspecified chronic kidney disease: Secondary | ICD-10-CM | POA: Insufficient documentation

## 2021-07-18 DIAGNOSIS — Z794 Long term (current) use of insulin: Secondary | ICD-10-CM | POA: Diagnosis not present

## 2021-07-18 DIAGNOSIS — N1831 Chronic kidney disease, stage 3a: Secondary | ICD-10-CM | POA: Insufficient documentation

## 2021-07-18 DIAGNOSIS — R079 Chest pain, unspecified: Secondary | ICD-10-CM | POA: Diagnosis present

## 2021-07-18 DIAGNOSIS — Z20822 Contact with and (suspected) exposure to covid-19: Secondary | ICD-10-CM | POA: Insufficient documentation

## 2021-07-18 DIAGNOSIS — E785 Hyperlipidemia, unspecified: Secondary | ICD-10-CM | POA: Insufficient documentation

## 2021-07-18 DIAGNOSIS — R002 Palpitations: Secondary | ICD-10-CM | POA: Diagnosis not present

## 2021-07-18 DIAGNOSIS — Z8542 Personal history of malignant neoplasm of other parts of uterus: Secondary | ICD-10-CM | POA: Diagnosis not present

## 2021-07-18 DIAGNOSIS — Z9989 Dependence on other enabling machines and devices: Secondary | ICD-10-CM

## 2021-07-18 DIAGNOSIS — R55 Syncope and collapse: Secondary | ICD-10-CM | POA: Diagnosis not present

## 2021-07-18 DIAGNOSIS — I959 Hypotension, unspecified: Secondary | ICD-10-CM | POA: Insufficient documentation

## 2021-07-18 DIAGNOSIS — G4733 Obstructive sleep apnea (adult) (pediatric): Secondary | ICD-10-CM

## 2021-07-18 DIAGNOSIS — I10 Essential (primary) hypertension: Secondary | ICD-10-CM | POA: Diagnosis present

## 2021-07-18 DIAGNOSIS — R4182 Altered mental status, unspecified: Secondary | ICD-10-CM | POA: Insufficient documentation

## 2021-07-18 DIAGNOSIS — E871 Hypo-osmolality and hyponatremia: Secondary | ICD-10-CM | POA: Insufficient documentation

## 2021-07-18 DIAGNOSIS — I499 Cardiac arrhythmia, unspecified: Secondary | ICD-10-CM | POA: Insufficient documentation

## 2021-07-18 DIAGNOSIS — Z955 Presence of coronary angioplasty implant and graft: Secondary | ICD-10-CM

## 2021-07-18 DIAGNOSIS — Z658 Other specified problems related to psychosocial circumstances: Secondary | ICD-10-CM

## 2021-07-18 DIAGNOSIS — Z6841 Body Mass Index (BMI) 40.0 and over, adult: Secondary | ICD-10-CM

## 2021-07-18 DIAGNOSIS — J449 Chronic obstructive pulmonary disease, unspecified: Secondary | ICD-10-CM | POA: Insufficient documentation

## 2021-07-18 DIAGNOSIS — E1165 Type 2 diabetes mellitus with hyperglycemia: Secondary | ICD-10-CM

## 2021-07-18 DIAGNOSIS — Z79899 Other long term (current) drug therapy: Secondary | ICD-10-CM | POA: Insufficient documentation

## 2021-07-18 HISTORY — DX: Malignant (primary) neoplasm, unspecified: C80.1

## 2021-07-18 LAB — CBC WITH DIFFERENTIAL/PLATELET
Abs Immature Granulocytes: 0.03 10*3/uL (ref 0.00–0.07)
Basophils Absolute: 0 10*3/uL (ref 0.0–0.1)
Basophils Relative: 0 %
Eosinophils Absolute: 0.1 10*3/uL (ref 0.0–0.5)
Eosinophils Relative: 1 %
HCT: 39.1 % (ref 36.0–46.0)
Hemoglobin: 12.8 g/dL (ref 12.0–15.0)
Immature Granulocytes: 0 %
Lymphocytes Relative: 19 %
Lymphs Abs: 1.7 10*3/uL (ref 0.7–4.0)
MCH: 29.7 pg (ref 26.0–34.0)
MCHC: 32.7 g/dL (ref 30.0–36.0)
MCV: 90.7 fL (ref 80.0–100.0)
Monocytes Absolute: 0.6 10*3/uL (ref 0.1–1.0)
Monocytes Relative: 6 %
Neutro Abs: 6.6 10*3/uL (ref 1.7–7.7)
Neutrophils Relative %: 74 %
Platelets: 225 10*3/uL (ref 150–400)
RBC: 4.31 MIL/uL (ref 3.87–5.11)
RDW: 13.8 % (ref 11.5–15.5)
WBC: 9 10*3/uL (ref 4.0–10.5)
nRBC: 0 % (ref 0.0–0.2)

## 2021-07-18 LAB — COMPREHENSIVE METABOLIC PANEL
ALT: 11 U/L (ref 0–44)
AST: 15 U/L (ref 15–41)
Albumin: 3.5 g/dL (ref 3.5–5.0)
Alkaline Phosphatase: 108 U/L (ref 38–126)
Anion gap: 8 (ref 5–15)
BUN: 15 mg/dL (ref 6–20)
CO2: 24 mmol/L (ref 22–32)
Calcium: 8.2 mg/dL — ABNORMAL LOW (ref 8.9–10.3)
Chloride: 99 mmol/L (ref 98–111)
Creatinine, Ser: 1.16 mg/dL — ABNORMAL HIGH (ref 0.44–1.00)
GFR, Estimated: 56 mL/min — ABNORMAL LOW (ref >60)
Glucose, Bld: 249 mg/dL — ABNORMAL HIGH (ref 70–99)
Potassium: 3.6 mmol/L (ref 3.5–5.1)
Sodium: 131 mmol/L — ABNORMAL LOW (ref 135–145)
Total Bilirubin: 0.6 mg/dL (ref 0.3–1.2)
Total Protein: 7.1 g/dL (ref 6.5–8.1)

## 2021-07-18 LAB — TROPONIN I (HIGH SENSITIVITY)
Troponin I (High Sensitivity): 9 ng/L (ref <18)
Troponin I (High Sensitivity): 7 ng/L (ref <18)

## 2021-07-18 LAB — RESP PANEL BY RT-PCR (FLU A&B, COVID) ARPGX2
Influenza A by PCR: NEGATIVE
Influenza B by PCR: NEGATIVE
SARS Coronavirus 2 by RT PCR: NEGATIVE

## 2021-07-18 LAB — CBG MONITORING, ED: Glucose-Capillary: 205 mg/dL — ABNORMAL HIGH (ref 70–99)

## 2021-07-18 LAB — LIPASE, BLOOD: Lipase: 35 U/L (ref 11–51)

## 2021-07-18 LAB — MAGNESIUM: Magnesium: 1.9 mg/dL (ref 1.7–2.4)

## 2021-07-18 LAB — D-DIMER, QUANTITATIVE: D-Dimer, Quant: 0.53 ug/mL-FEU — ABNORMAL HIGH (ref 0.00–0.50)

## 2021-07-18 MED ORDER — CARVEDILOL 25 MG PO TABS
25.0000 mg | ORAL_TABLET | Freq: Two times a day (BID) | ORAL | Status: DC
  Administered 2021-07-18: 25 mg via ORAL
  Filled 2021-07-18 (×2): qty 1

## 2021-07-18 MED ORDER — ASPIRIN EC 81 MG PO TBEC
81.0000 mg | DELAYED_RELEASE_TABLET | Freq: Every day | ORAL | Status: DC
  Administered 2021-07-19 – 2021-07-20 (×2): 81 mg via ORAL
  Filled 2021-07-18 (×2): qty 1

## 2021-07-18 MED ORDER — ONDANSETRON HCL 4 MG/2ML IJ SOLN
4.0000 mg | Freq: Four times a day (QID) | INTRAMUSCULAR | Status: DC | PRN
Start: 2021-07-18 — End: 2021-07-20

## 2021-07-18 MED ORDER — ALPRAZOLAM 0.5 MG PO TABS: 0.5000 mg | ORAL_TABLET | Freq: Three times a day (TID) | ORAL | Status: DC | PRN

## 2021-07-18 MED ORDER — CITALOPRAM HYDROBROMIDE 20 MG PO TABS
40.0000 mg | ORAL_TABLET | Freq: Every day | ORAL | Status: DC
  Administered 2021-07-18 – 2021-07-19 (×2): 40 mg via ORAL
  Filled 2021-07-18 (×2): qty 2

## 2021-07-18 MED ORDER — ONDANSETRON HCL 4 MG PO TABS: 4.0000 mg | ORAL_TABLET | Freq: Four times a day (QID) | ORAL | Status: DC | PRN

## 2021-07-18 MED ORDER — MONTELUKAST SODIUM 10 MG PO TABS
10.0000 mg | ORAL_TABLET | Freq: Every day | ORAL | Status: DC
  Administered 2021-07-18 – 2021-07-19 (×2): 10 mg via ORAL
  Filled 2021-07-18 (×2): qty 1

## 2021-07-18 MED ORDER — ACETAMINOPHEN 650 MG RE SUPP: 650.0000 mg | Freq: Four times a day (QID) | RECTAL | Status: DC | PRN

## 2021-07-18 MED ORDER — ALBUTEROL SULFATE (2.5 MG/3ML) 0.083% IN NEBU: 2.5000 mg | INHALATION_SOLUTION | RESPIRATORY_TRACT | Status: DC | PRN

## 2021-07-18 MED ORDER — GABAPENTIN 300 MG PO CAPS
300.0000 mg | ORAL_CAPSULE | Freq: Two times a day (BID) | ORAL | Status: DC
  Administered 2021-07-18 – 2021-07-19 (×2): 300 mg via ORAL
  Filled 2021-07-18 (×2): qty 1

## 2021-07-18 MED ORDER — ACETAMINOPHEN 325 MG PO TABS: 650.0000 mg | ORAL_TABLET | Freq: Four times a day (QID) | ORAL | Status: DC | PRN

## 2021-07-18 MED ORDER — ENOXAPARIN SODIUM 100 MG/ML IJ SOSY
0.5000 mg/kg | PREFILLED_SYRINGE | INTRAMUSCULAR | Status: DC
  Administered 2021-07-19 – 2021-07-20 (×2): 85 mg via SUBCUTANEOUS
  Filled 2021-07-18 (×2): qty 0.85

## 2021-07-18 MED ORDER — ATORVASTATIN CALCIUM 80 MG PO TABS
80.0000 mg | ORAL_TABLET | Freq: Every evening | ORAL | Status: DC
  Administered 2021-07-18: 80 mg via ORAL
  Filled 2021-07-18: qty 4

## 2021-07-18 MED ORDER — HYDROCODONE-ACETAMINOPHEN 5-325 MG PO TABS
1.0000 | ORAL_TABLET | ORAL | Status: DC | PRN
  Administered 2021-07-19 – 2021-07-20 (×2): 2 via ORAL
  Filled 2021-07-18: qty 2
  Filled 2021-07-18 (×2): qty 1

## 2021-07-18 MED ORDER — LOSARTAN POTASSIUM 25 MG PO TABS
25.0000 mg | ORAL_TABLET | Freq: Every day | ORAL | Status: DC
  Administered 2021-07-19: 25 mg via ORAL
  Filled 2021-07-18: qty 1

## 2021-07-18 MED ORDER — IOHEXOL 350 MG/ML SOLN
125.0000 mL | Freq: Once | INTRAVENOUS | Status: AC | PRN
  Administered 2021-07-18: 125 mL via INTRAVENOUS

## 2021-07-18 MED ORDER — SODIUM CHLORIDE 0.9 % IV BOLUS
1000.0000 mL | Freq: Once | INTRAVENOUS | Status: AC
Start: 2021-07-18 — End: 2021-07-18
  Administered 2021-07-18: 1000 mL via INTRAVENOUS

## 2021-07-18 MED ORDER — INSULIN DETEMIR 100 UNIT/ML ~~LOC~~ SOLN
50.0000 [IU] | Freq: Every day | SUBCUTANEOUS | Status: DC
  Administered 2021-07-18 – 2021-07-19 (×2): 50 [IU] via SUBCUTANEOUS
  Filled 2021-07-18 (×4): qty 0.5

## 2021-07-18 MED ORDER — SODIUM CHLORIDE 0.9% FLUSH
3.0000 mL | Freq: Two times a day (BID) | INTRAVENOUS | Status: DC
  Administered 2021-07-18 – 2021-07-19 (×2): 3 mL via INTRAVENOUS

## 2021-07-18 NOTE — ED Notes (Signed)
Pt currently placed on CPAP by RT. Pt states she wears CPAP at home

## 2021-07-18 NOTE — ED Notes (Signed)
Lab called to obtain 2nd troponin

## 2021-07-18 NOTE — Assessment & Plan Note (Signed)
CPAP nightly

## 2021-07-18 NOTE — Assessment & Plan Note (Addendum)
Complicating factor to overall prognosis and care recommended lifestyle changes.

## 2021-07-18 NOTE — Assessment & Plan Note (Signed)
Renal function for the most part at baseline

## 2021-07-18 NOTE — Assessment & Plan Note (Addendum)
Differential includes orthostatic hypotension, arrhythmia CTA ruled out PE, ACS work-up negative so far Syncope work-up to include continuous cardiac monitoring, echo Orthostatic checks Optimize electrolytes Increase home carvedilol to 25 twice daily Cardiology consult

## 2021-07-18 NOTE — H&P (Signed)
History and Physical    Patient: Tamara Meyer DOB: July 31, 1965 DOA: 07/18/2021 DOS: the patient was seen and examined on 07/18/2021 PCP: Sallee Lange, NP  Patient coming from: Home  Chief Complaint:  Chief Complaint  Patient presents with   Chest Pain    HPI: Tamara Meyer is a 56 y.o. female with medical history significant of DM, HTN, CAD s/p stent 2014 with negative nuclear stress test in 2017, class IV obesity with history of gastric bypass, OSA on CPAP, anxiety, COPD who presents to the ED with a syncopal episode that was preceded by palpitations.  She states that she was leaving the bathroom at work and felt her heart fluttering which she had felt before and it was associated with epigastric pain radiating to the right arm.  The next thing she knew she woke up on the ground.  She had shortness of breath after waking which she believes was related to anxiety. She reports a lot of work and personal stressors.  With regards to the fall she has pain in the right knee but denies headache neck pain or injury from a syncopal episode.  EMS administered nitro and aspirin in route  ED course: Upon arrival vitals : Mild tachypnea to 28 and borderline hypotension of 105/55 Blood work troponin 9-7, D-dimer 0.53 Glucose 249, sodium 131, creatinine 1.16 which is at baseline CMP otherwise unremarkable.  CBC WNL  EKG #1 at 1600, personally reviewed and interpreted: Sinus rhythm at 89 with no acute ST-T wave changes  Imaging: CT angio PE protocol negative for PE.  Lungs are clear.  Mild low lipoma of the left adrenal gland Please see full report Trauma imaging with CT head and C-spine negative  While in the ED patient had a monitor alarming V. tach had corresponding fluttering feeling in the chest.  Repeat EKG at 1654 showed sinus rhythm. Monitor review showed bigeminy. Strips unavailable.  Patient treated with an NS bolus.  BP improved with IV fluid bolus prior to  admission.  Hospitalist consulted for admission.    Review of Systems: As mentioned in the history of present illness. All other systems reviewed and are negative. Past Medical History:  Diagnosis Date   Anxiety    Cancer ([place])    Pt states they removed polyps from uterus   Depression    Diabetes mellitus without complication ([place])    Diabetes mellitus, type II ([place])    Hyperlipidemia    Hypertension    Past Surgical History:  Procedure Laterality Date   BARIATRIC SURGERY     CAROTID STENT  12/22/2012   Social History:  reports that she has never smoked. She has never used smokeless tobacco. She reports that she does not drink alcohol and does not use drugs.  Allergies  Allergen Reactions   Bee Venom Anaphylaxis   Meloxicam Hives   Diphenoxylate-Atropine    Lisinopril Swelling and Other (See Comments)    Reaction: Throat closes   Lamotrigine Rash   Pineapple Itching    Itchy tongue - mild allergy reported Itchy tongue - mild allergy reported     Family History  Problem Relation Age of Onset   Heart disease Mother    Cancer Mother        BREAST CANCER    Bipolar disorder Maternal Grandmother    Depression Maternal Grandmother    Anxiety disorder Maternal Grandmother     Prior to Admission medications   Medication Sig Start Date End Date Taking?  Authorizing Provider  amLODipine (NORVASC) 5 MG tablet Take 5 mg by mouth daily. 04/05/21  Yes [provider]  aspirin EC 81 MG tablet Take 81 mg by mouth daily. Swallow whole.   Yes [provider]  atorvastatin (LIPITOR) 80 MG tablet Take 80 mg by mouth every evening. 05/12/21  Yes [provider]  budesonide-formoterol (SYMBICORT) 160-4.5 MCG/ACT inhaler Inhale into the lungs. 08/20/17 07/18/21 Yes [provider]  carvedilol (COREG) 12.5 MG tablet Take 12.5 mg by mouth 2 (two) times daily with a meal.   Yes [provider]  citalopram (CELEXA) 40 MG tablet Take 1 tablet (40 mg  total) by mouth daily. 10/08/17  Yes Ursula Alert, MD  Dapagliflozin-metFORMIN HCl ER 10-998 MG TB24 Take 1 tablet by mouth 2 (two) times daily.   Yes [provider]  gabapentin (NEURONTIN) 300 MG capsule Take 300 mg by mouth 2 (two) times daily. 05/12/21  Yes [provider]  insulin detemir (LEVEMIR) 100 UNIT/ML FlexPen Inject 50 Units into the skin at bedtime.   Yes [provider]  losartan (COZAAR) 25 MG tablet Take 25 mg by mouth daily. 04/05/21  Yes [provider]  montelukast (SINGULAIR) 10 MG tablet Take 10 mg by mouth at bedtime. 08/06/17  Yes [provider]  TRESIBA FLEXTOUCH 200 UNIT/ML FlexTouch Pen Inject 130 Units into the skin daily. 04/02/21  Yes [provider]  albuterol (PROVENTIL) (2.5 MG/3ML) 0.083% nebulizer solution Inhale into the lungs. 08/20/17 08/20/18  [provider]  albuterol (VENTOLIN HFA) 108 (90 Base) MCG/ACT inhaler INL 2 PUFFS PO INTO THE LUNGS Q 4 H PRF WHZ 01/31/17   [provider]  azelastine (ASTELIN) 0.1 % nasal spray Place 2 sprays into both nostrils 2 (two) times daily. 05/12/21   [provider]  clotrimazole-betamethasone (LOTRISONE) cream Apply 1 application topically 2 (two) times daily. 05/12/21   [provider]  EPINEPHrine 0.3 mg/0.3 mL IJ SOAJ injection  11/21/16   [provider]  fluticasone (FLONASE) 50 MCG/ACT nasal spray Place into the nose. 08/20/17 08/20/18  [provider]  insulin aspart (NOVOLOG) 100 UNIT/ML FlexPen Inject 40 Units into the skin 3 (three) times daily with meals. Patient not taking: Reported on 07/18/2021    [provider]  Insulin Pen Needle (NOVOFINE) 30G X 8 MM MISC  08/08/17   [provider]  nitrofurantoin, macrocrystal-monohydrate, (MACROBID) 100 MG capsule Take 1 capsule (100 mg total) by mouth 2 (two) times daily. Patient not taking: Reported on 07/18/2021 01/06/19   Kennyth Arnold, FNP  [facility]  DELICA LANCETS FINE MISC Use 1 each 3 (three) times daily 06/27/17   [provider]  OZEMPIC, 1 MG/DOSE, 4 MG/3ML SOPN Inject 1 mg into the skin once a week. 07/10/21   [provider]    Physical Exam: Vitals:   07/18/21 1730 07/18/21 1800 07/18/21 1830 07/18/21 1935  BP: (!) 105/55 122/75 (!) 134/59 (!) 137/94  Pulse: 82 99 82 80  Resp: (!) 28 16 (!) 26 19  Temp:      TempSrc:      SpO2: 97% 95% 98% 100%  Weight:      Height:       Physical Exam Vitals and nursing note reviewed.  Constitutional:      General: She is not in acute distress.    Appearance: Normal appearance. She is obese.  HENT:     Head: Normocephalic and atraumatic.  Cardiovascular:     Rate  and Rhythm: Normal rate and regular rhythm.     Pulses: Normal pulses.     Heart sounds: Normal heart sounds. No murmur heard. Pulmonary:     Effort: Pulmonary effort is normal.     Breath sounds: Normal breath sounds. No wheezing or rhonchi.  Abdominal:     General: Bowel sounds are normal.     Palpations: Abdomen is soft.     Tenderness: There is no abdominal tenderness.  Musculoskeletal:        General: No swelling or tenderness. Normal range of motion.     Cervical back: Normal range of motion and neck supple.  Skin:    General: Skin is warm and dry.  Neurological:     General: No focal deficit present.     Mental Status: She is alert. Mental status is at baseline.  Psychiatric:        Mood and Affect: Mood normal.        Behavior: Behavior normal.     Data Reviewed: Notes from primary care and specialist visits, past discharge summaries. Prior diagnostic testing as applicable to current admission diagnoses Updated medications and problem lists for reconciliation ED course, including vitals, labs, imaging, treatment and response to treatment Triage notes and ED providers notes   Assessment and Plan: * Syncope and collapse Differential includes orthostatic hypotension, arrhythmia CTA  ruled out PE, ACS work-up negative so far Syncope work-up to include continuous cardiac monitoring, echo Orthostatic checks Optimize electrolytes Increase home carvedilol to 25 twice daily Cardiology consult   Hypotension BP 105/55 on arrival, improving to 137/94 following fluid bolus Hold amlodipine and losartan until the a.m. We will continue carvedilol due to palpitations  Tachyarrhythmia, paroxysmal Concerning rhythm (bigeminy) seen on monitor while in the ED, not captured on EKG Obtain monitor strips Maintain magnesium over 2 and potassium over 4 Additional management as above   CAD with history of coronary artery stent placement 2014 Patient had mild chest/epigastric pain radiating to the right arm but troponins negative EKG nonacute Negative nuclear stress test 2017 Echocardiogram to evaluate for wall motion abnormality Cardiology consult to determine need for additional risk stratification   Hyponatremia Received an IV fluid bolus of NS in the ED Continue to monitor and correct as needed  Uncontrolled type 2 diabetes mellitus with hyperglycemia, with long-term current use of insulin (HCC) Blood sugars in the mid 200s and last A1c on record was 9.1 Sliding scale insulin Continue Levemir  Anxiety with Psychosocial stressors Psych consult was placed from the ED Xanax prn pending psych consult  Chronic kidney disease, stage 3a ([place]) Renal function for the most part at baseline  Morbid obesity with BMI of 50.0-59.9, adult (HCC) Complicating factor to overall prognosis and care  OSA on CPAP CPAP nightly  Essential hypertension- (present on admission) Was borderline hypotensive on arrival Resume antihypertensives as appropriate    Advance Care Planning:   Code Status: Full Code   Consults: cardiologoy  Family Communication: none  Severity of Illness: observation   Author: Athena Masse, MD 07/18/2021 8:01 PM  For on call review [email or web address].

## 2021-07-18 NOTE — Assessment & Plan Note (Signed)
Received an IV fluid bolus of NS in the ED Continue to monitor and correct as needed

## 2021-07-18 NOTE — Assessment & Plan Note (Signed)
SVT on monitor while in the ED, not captured on EKG Try to obtain monitor strips Maintain magnesium over 2 and potassium over 4 Additional management as above

## 2021-07-18 NOTE — Assessment & Plan Note (Addendum)
Blood sugars in the mid 200s and last A1c on record 8.5. Sliding scale insulin patient was seen by diabetes coordinator. Discontinue Levemir. Continue tresiba, and oral meds as before. Patient is placed on NovoLog flex pen.

## 2021-07-18 NOTE — Progress Notes (Signed)
PHARMACIST - PHYSICIAN COMMUNICATION  CONCERNING:  Enoxaparin (Lovenox) for DVT Prophylaxis    RECOMMENDATION: Patient was prescribed enoxaprin 95m q24 hours for VTE prophylaxis.   Filed Weights   07/18/21 1606  Weight: (!) 171.7 kg (378 lb 8 oz)    Body mass index is 54.31 kg/m.  Estimated Creatinine Clearance: 97.2 mL/min (A) (by C-G formula based on SCr of 1.16 mg/dL (H)).   [place] for enoxaparin 0.534mkg TBW SQ every 24 hours based on BMI being >30.  DESCRIPTION: [facility]  Patient is now receiving enoxaparin 0.5 mg/kg every 24 hours   NaRenda RollsPharmD, MBFallbrook Hosp District Skilled Nursing Facility/11/2021 10:45 PM

## 2021-07-18 NOTE — Assessment & Plan Note (Addendum)
Psych consult was placed from the ED patient was seen by psychiatry nurse practitioner continue Celexa

## 2021-07-18 NOTE — ED Notes (Signed)
Monitoring alarming V Tach, this RN and kelley RN went to bedside, pt states she had "fluttering feeling in chest", Repeat EKG done and EDP [place] Made aware.

## 2021-07-18 NOTE — Assessment & Plan Note (Addendum)
Concerning rhythm (bigeminy) seen on monitor while in the ED, not captured on EKG Obtain monitor strips Maintain magnesium over 2 and potassium over 4 Additional management as above

## 2021-07-18 NOTE — Assessment & Plan Note (Addendum)
-  Patient had mild chest/epigastric pain radiating to the right arm but troponins negative EKG nonacute -Negative nuclear stress test 2017 Echocardiogram EF 60 to 65%  --cardiology consult with Dr. Saralyn Pilar. Patient recommended Holter monitor. Cardiology recommends continue Coreg and amlodipine given palpitations. EKG no acute changes. -- Patient will follow-up cardiology as outpatient. Denies any chest pain today feels overall better. Blood pressure much improved. On telemetry remains in sinus rhythm

## 2021-07-18 NOTE — ED Triage Notes (Addendum)
Pt [facility] [place] [facility] c/o CP. Pt does have SOB with CP. Per family Pt was working at [employer] and pt clutched her chest and had episode of LOC for about 10 seconds. Denies hitting head.  Pt states she remembers having epigastric pain and R arm "tingling". Pt states that she feels like her heart is fluttering.   Pt has hx of anxiety and stent put in Pt states she has been under a lot of stress.  PTA EMS given: 324 ASA, and Nitro spray  Pt is A&oX4.
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
## 2021-07-18 NOTE — Assessment & Plan Note (Signed)
BP 105/55 on arrival, improving to 137/94 following fluid bolus Hold amlodipine and losartan until the a.m. We will continue carvedilol due to palpitations
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
## 2021-07-18 NOTE — Assessment & Plan Note (Addendum)
Was borderline hypotensive on arrival -- continue Coreg and amlodipine. -- Losartan on hold for

## 2021-07-19 ENCOUNTER — Encounter: Payer: Self-pay | Admitting: Internal Medicine

## 2021-07-19 ENCOUNTER — Observation Stay (HOSPITAL_BASED_OUTPATIENT_CLINIC_OR_DEPARTMENT_OTHER)
Admit: 2021-07-19 | Discharge: 2021-07-19 | Disposition: A | Discharge status: Home / Self Care | Payer: Medicare HMO | Attending: Internal Medicine | Admitting: Internal Medicine

## 2021-07-19 DIAGNOSIS — R55 Syncope and collapse: Secondary | ICD-10-CM

## 2021-07-19 LAB — GLUCOSE, CAPILLARY
Glucose-Capillary: 216 mg/dL — ABNORMAL HIGH (ref 70–99)
Glucose-Capillary: 191 mg/dL — ABNORMAL HIGH (ref 70–99)
Glucose-Capillary: 228 mg/dL — ABNORMAL HIGH (ref 70–99)

## 2021-07-19 LAB — BASIC METABOLIC PANEL
Anion gap: 6 (ref 5–15)
BUN: 19 mg/dL (ref 6–20)
CO2: 24 mmol/L (ref 22–32)
Calcium: 8.4 mg/dL — ABNORMAL LOW (ref 8.9–10.3)
Chloride: 105 mmol/L (ref 98–111)
Creatinine, Ser: 1.24 mg/dL — ABNORMAL HIGH (ref 0.44–1.00)
GFR, Estimated: 52 mL/min — ABNORMAL LOW (ref >60)
Glucose, Bld: 246 mg/dL — ABNORMAL HIGH (ref 70–99)
Potassium: 4.3 mmol/L (ref 3.5–5.1)
Sodium: 135 mmol/L (ref 135–145)

## 2021-07-19 LAB — HIV ANTIBODY (ROUTINE TESTING W REFLEX): HIV Screen 4th Generation wRfx: NONREACTIVE

## 2021-07-19 LAB — ECHOCARDIOGRAM COMPLETE
Height: 70 in
S' Lateral: 3.1 cm
Weight: 6056 oz

## 2021-07-19 LAB — HEMOGLOBIN A1C
Hgb A1c MFr Bld: 8.5 % — ABNORMAL HIGH (ref 4.8–5.6)
Mean Plasma Glucose: 197.25 mg/dL

## 2021-07-19 LAB — CBG MONITORING, ED: Glucose-Capillary: 245 mg/dL — ABNORMAL HIGH (ref 70–99)

## 2021-07-19 LAB — MAGNESIUM: Magnesium: 2.1 mg/dL (ref 1.7–2.4)

## 2021-07-19 MED ORDER — AMLODIPINE BESYLATE 5 MG PO TABS
5.0000 mg | ORAL_TABLET | Freq: Every day | ORAL | Status: DC
  Administered 2021-07-20: 5 mg via ORAL
  Filled 2021-07-19: qty 1

## 2021-07-19 MED ORDER — INSULIN ASPART 100 UNIT/ML IJ SOLN
2.0000 [IU] | Freq: Three times a day (TID) | INTRAMUSCULAR | Status: DC
  Administered 2021-07-19 – 2021-07-20 (×3): 2 [IU] via SUBCUTANEOUS
  Filled 2021-07-19 (×3): qty 1

## 2021-07-19 MED ORDER — CARVEDILOL 6.25 MG PO TABS: 6.2500 mg | ORAL_TABLET | Freq: Two times a day (BID) | ORAL | Status: DC

## 2021-07-19 MED ORDER — FLUTICASONE PROPIONATE 50 MCG/ACT NA SUSP
1.0000 | Freq: Every day | NASAL | Status: DC
  Filled 2021-07-19: qty 16

## 2021-07-19 MED ORDER — METFORMIN HCL ER 500 MG PO TB24
1000.0000 mg | ORAL_TABLET | Freq: Two times a day (BID) | ORAL | Status: DC
  Administered 2021-07-20: 1000 mg via ORAL
  Filled 2021-07-19: qty 2

## 2021-07-19 MED ORDER — MAGNESIUM SULFATE 2 GM/50ML IV SOLN
2.0000 g | Freq: Once | INTRAVENOUS | Status: AC
  Administered 2021-07-19: 2 g via INTRAVENOUS
  Filled 2021-07-19: qty 50

## 2021-07-19 MED ORDER — MOMETASONE FURO-FORMOTEROL FUM 200-5 MCG/ACT IN AERO
2.0000 | INHALATION_SPRAY | Freq: Two times a day (BID) | RESPIRATORY_TRACT | Status: DC
  Administered 2021-07-19 – 2021-07-20 (×2): 2 via RESPIRATORY_TRACT
  Filled 2021-07-19 (×2): qty 8.8

## 2021-07-19 MED ORDER — DAPAGLIFLOZIN PRO-METFORMIN ER 5-1000 MG PO TB24: 1.0000 | ORAL_TABLET | Freq: Two times a day (BID) | ORAL | Status: DC

## 2021-07-19 MED ORDER — SEMAGLUTIDE (1 MG/DOSE) 4 MG/3ML ~~LOC~~ SOPN: 1.0000 mg | PEN_INJECTOR | SUBCUTANEOUS | Status: DC

## 2021-07-19 MED ORDER — GABAPENTIN 300 MG PO CAPS
300.0000 mg | ORAL_CAPSULE | Freq: Every day | ORAL | Status: DC
  Administered 2021-07-20: 300 mg via ORAL
  Filled 2021-07-19: qty 1

## 2021-07-19 MED ORDER — SODIUM CHLORIDE 0.9 % IV SOLN: INTRAVENOUS | Status: DC

## 2021-07-19 MED ORDER — INSULIN ASPART 100 UNIT/ML IJ SOLN
0.0000 [IU] | Freq: Three times a day (TID) | INTRAMUSCULAR | Status: DC
  Administered 2021-07-19: 5 [IU] via SUBCUTANEOUS
  Administered 2021-07-19: 3 [IU] via SUBCUTANEOUS
  Administered 2021-07-20: 5 [IU] via SUBCUTANEOUS
  Filled 2021-07-19 (×3): qty 1

## 2021-07-19 MED ORDER — CARVEDILOL 3.125 MG PO TABS
3.1250 mg | ORAL_TABLET | Freq: Two times a day (BID) | ORAL | Status: DC
  Administered 2021-07-19 – 2021-07-20 (×2): 3.125 mg via ORAL
  Filled 2021-07-19 (×2): qty 1

## 2021-07-19 MED ORDER — DAPAGLIFLOZIN PROPANEDIOL 5 MG PO TABS
5.0000 mg | ORAL_TABLET | Freq: Two times a day (BID) | ORAL | Status: DC
  Administered 2021-07-20: 5 mg via ORAL
  Filled 2021-07-19: qty 1

## 2021-07-19 NOTE — ED Notes (Signed)
Pt transitioned to a hospital bed to promote comfort.

## 2021-07-19 NOTE — ED Notes (Signed)
Billy RN aware of assigned bed
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
## 2021-07-19 NOTE — Progress Notes (Signed)
Inpatient Diabetes Program Recommendations  AACE/ADA: New Consensus Statement on Inpatient Glycemic Control   Target Ranges:  Prepandial:   less than 140 mg/dL      Peak postprandial:   less than 180 mg/dL (1-2 hours)      Critically ill patients:  140 - 180 mg/dL    Latest Reference Range & Units 07/18/21 23:30 07/19/21 05:52  Glucose-Capillary 70 - 99 mg/dL 205 (H) 245 (H)   Review of Glycemic Control  Diabetes history: DM2 Outpatient Diabetes medications: Ozempic 1 mg Qweek (Tuesday), Tresiba 130 units daily, Xigduo 10-998 mg BID, Levemir 10-40 units if glucose is elevated Current orders for Inpatient glycemic control: Levemir 50 units QHS, Novolog 0-15 units TID with meals, Novolog 2 units TID with meals  Inpatient Diabetes Program Recommendations:    Outpatient DM: Patient is taking [place] and using Levemir when glucose is elevated. Would recommend to discontinue Levemir outpatient and have patient continue Tyler Aas as already taking and prescribe Novolog Flexpen [phone]) 0-20 units TID with meals for correction (using similar to hospital Resistant Scale).  NOTE: Noted consult for diabetes coordinator regarding possible Dexcom. Inpatient diabetes team does not have any Dexcom CGM sensor but does has samples of FreeStyle Libre2 CGM sensors. Will communicate with Dr. Verlon Au to see if okay to provide samples of FreeStyle Libre2 CGM to patient. Noted patient has 2 basal insulin on home med list (Levemir and [place]). In reviewing chart, noted patient see Valentino Hue, NP on 05/11/21 and per office note "A1c 9.1% today, last 11.6%. Discontinue Triseba 115 units daily. Continue Levimir 50 units BID, Ozempic 1 mg weekly and Xigduo XR 5-1,000 2 tablets before breakfast. Encouraged patient to contact Endocrinology to schedule follow up." Last Endocrinology visit in [place] was 12/09/19.    Spoke with patient about diabetes and home regimen for diabetes control. Patient reports  being followed by PCP for diabetes management and currently taking Ozempic 1 mg Qweek (Tuesday), Tresiba 130 units daily, Xigduo 10-998 mg BID, and Levemir 10-40 units if glucose is elevated as an outpatient for diabetes control. Inquired about using short acting insulin outpatient and patient reports she was on Novolog in the past but it was stopped when she was started on Tresiba and Ozempic.  Discussed Tresiba insulin and Levemir insulin and explained that they are both considered basal/long acting insulin and it is not typical to be taking 2 basal insulins. Discussed how short acting insulin (Novolog/Humalog) is typically used for correction when glucose is elevated (onset 15 minutes and duration of about 4 hours).  Discussed FreeStyle Libre2 CGM and patient states that she would love to use FreeStyle Libre2. Dr. Verlon Au provided order to give patient samples of FreeStyle Libre2; provided sample sensors and reader (left at bedside). Reviewed how to apply and use the FreeStyle Libre2, alarms, warm up period of 1 hour, and discussed benefits of using CGM to provide more data on glucose trends for her and her providers. Patient had noted that she was under a great deal of stress and feels her glucose is more elevated due to stress. Patient also notes that she was without Ozempic for several weeks (from October to just filled in January) because the pharmacy was out of the medication.  Patient states she seen weight loss with initially starting Ozempic but questions if she needs to be on a different one DM medication Darcel Bayley) that she has seen advertised on TV.  Patient reports that her PCP was suppose to refer her to Endocrinology  but she has not heard anything from PCP or Endocrinologist about setting up an appointment. She plans to send a message to PCP via MyChart to ask about the referral to an Endocrinologist. Patient states she would be willing to take Novolog insulin for correction if prescribed at  discharge. Patient states she rotates insulin injection sites. Encouraged patient to ask PCP/Endocrinologist if she should be splitting dose of Tresiba (for example take 65 units in 2 separate locations for a total of 130 units) to see if that would help with absorption and glucose control. Patient also reports that she has been scheduled for gastric bypass 2 times but both times unsuccessful (patient states procedure was not able to be completed).  Encouraged patient to get established with Endocrinologist so they can assist with getting DM  under better control.   Patient verbalized understanding of information discussed and reports no further questions at this time related to diabetes. Will plan to follow up with patient on day closer to discharge to assist with application of FreeStyle Libre2 CGM.  Thanks, Barnie Alderman, RN, MSN, CDE Diabetes Coordinator Inpatient Diabetes Program [phone] (Team Pager from 8am to 5pm)

## 2021-07-19 NOTE — Consult Note (Signed)
[place] NOTE       Patient ID: Tamara Meyer MRN: [id number] DOB/AGE: Apr 27, 1966 56 y.o.  Admit date: 07/18/2021 Referring Physician Dr. Burnett Harry Primary Physician Dr. Sharyn Creamer [facility] primary care)  Primary Cardiologist Dr. Bartholome Bill  Reason for Consultation Chest pain   HPI: The patient is a 34yoF with a past medical history significant for CAD s/p PCI in 2014, HTN, Hyperlipidemia, CKD3a, type 2 diabetes, obesity s/p gastric bypass, OSA on CPAP, reported history of paroxysmal atrial fibrillation in the setting of COVID 19 infection, anxiety, who presented to [facility] ED 07/18/2021 after a syncopal episode proceeded by palpitations and epigastric pain radiating to her right arm. Cardiology is consulted for further evaluation of a cardiac cause of her symptoms.   The patient reports she has been under significant stress at work lately, she works in church administration and she has had some conflicts with her boss over the weekend. She states she was at work on Monday morning and was feeling very overwhelmed in general and fatigued. She went to use the restroom, walked out the doors into the hall, and suddenly fainted. She said she felt palpitations and like she couldn't breathe before she fainted, in addition to a pressure sensation in her chest. She denies losing control of her bowels or hitting anything when she fell. She eventually regained consciousness and her son brought her to the ED. 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 She reports feeling much better today, had a brief moment of "fluttering" in her chest this morning but denies further SOB, or chest pain. The fluttering sensation was reportedly seen as ventricular bigeminy on telemetry. During my review it looked like PVCs vs artifact.  She reports she had COVID in December 2021  and reportedly has a period of atrial fibrillation during her hospitalization and was not on anticoagulation for long d/t vaginal bleeding. She had a cardic cath in 2014 for what sounds like unstable angina and had one stent placed.   She had a low risk lexiscan in 2017, EF of 55-65% without any ST segment deviation during stress.   Vitals on admission are notable for a blood pressure of 105/55, peaking at 145/77 overnight and low at 99/59 this morning.   Labs are notable for Cr 1.24, GFR 52, troponin flat 9-7. D-dimer slightly elevated at 0.53. CTA chest was negative for significant PE. CXR showed a small effusion on the right.    Review of systems complete and found to be negative unless listed above   Past Medical History:  Diagnosis Date   Anxiety    Cancer ([place])    Pt states they removed polyps from uterus   Depression    Diabetes mellitus without complication ([place])    Diabetes mellitus, type II ([place])    Hyperlipidemia    Hypertension     Past Surgical History:  Procedure Laterality Date   BARIATRIC SURGERY     CAROTID STENT  12/22/2012    (Not in a hospital admission)  Social History   Socioeconomic History   Marital status: Single    Spouse name: Not on file   Number of children: 2   Years of education: Not on file   Highest education level: Bachelor's degree (e.g., BA, AB, BS)  Occupational History    Comment: disabled  Tobacco Use  Smoking status: Never   Smokeless tobacco: Never  Vaping Use   Vaping Use: Never used  Substance and Sexual Activity   Alcohol use: No    Alcohol/week: 0.0 standard drinks   Drug use: No   Sexual activity: Not Currently  Other Topics Concern   Not on file  Social History Narrative   Not on file   Social Determinants of Health   Financial Resource Strain: Not on file  Food Insecurity: Not on file  Transportation Needs: Not on file  Physical Activity: Not on file  Stress: Not on file  Social Connections: Not on file   Intimate Partner Violence: Not on file    Family History  Problem Relation Age of Onset   Heart disease Mother    Cancer Mother        BREAST CANCER    Bipolar disorder Maternal Grandmother    Depression Maternal Grandmother    Anxiety disorder Maternal Grandmother       Review of systems complete and found to be negative unless listed above    PHYSICAL EXAM General: Pleasant black female , well nourished, in no acute distress. Sitting upright comfortably in PCU bed.  HEENT:  Normocephalic and atraumatic. Neck:  No JVD.  Lungs: Normal respiratory effort on room air. Clear bilaterally to auscultation. No wheezes, crackles, rhonchi.  Heart: HRRR . Normal S1 and S2 without gallops or murmurs. Radial & DP pulses 2+ bilaterally. Abdomen: Obese appearing.  Msk: Normal strength and tone for age. Extremities: Warm and well perfused. No clubbing, cyanosis. No LE edema.  Neuro: Alert and oriented X 3. Psych:  Answers questions appropriately.   Labs:   Lab Results  Component Value Date   WBC 9.0 07/18/2021   HGB 12.8 07/18/2021   HCT 39.1 07/18/2021   MCV 90.7 07/18/2021   PLT 225 07/18/2021    Recent Labs  Lab 07/18/21 1621 07/19/21 0635  NA 131* 135  K 3.6 4.3  CL 99 105  CO2 24 24  BUN 15 19  CREATININE 1.16* 1.24*  CALCIUM 8.2* 8.4*  PROT 7.1  --   BILITOT 0.6  --   ALKPHOS 108  --   ALT 11  --   AST 15  --   GLUCOSE 249* 246*   Lab Results  Component Value Date   TROPONINI <0.03 05/07/2017   No results found for: CHOL No results found for: HDL No results found for: LDLCALC No results found for: TRIG No results found for: CHOLHDL No results found for: LDLDIRECT    Radiology: CT HEAD WO CONTRAST (5MM)  Result Date: 07/18/2021 CLINICAL DATA:  Head trauma with abnormal mental status. Loss of consciousness for 10 seconds. EXAM: CT HEAD WITHOUT CONTRAST TECHNIQUE: Contiguous axial images were obtained from the base of the skull through the vertex without  intravenous contrast. RADIATION DOSE REDUCTION: This exam was performed according to the departmental dose-optimization program which includes automated exposure control, adjustment of the mA and/or kV according to patient size and/or use of iterative reconstruction technique. COMPARISON:  None. FINDINGS: Brain: No evidence of acute infarction, hemorrhage, hydrocephalus, extra-axial collection or mass lesion/mass effect. Mild cerebral atrophy. Old appearing lacunar infarct in the right basal ganglia. Vascular: Intracranial arterial vascular calcifications are present. Skull: Calvarium appears intact. Sinuses/Orbits: Paranasal sinuses and mastoid air cells are clear. Other: Streak artifact limits the technical quality of the examination. IMPRESSION: No acute intracranial abnormalities. Mild chronic atrophy. Old lacunar infarcts. Electronically Signed   By: Gwyndolyn Saxon  Gerilyn Nestle M.D.   On: 07/18/2021 17:37   CT Angio Chest PE W and/or Wo Contrast  Result Date: 07/18/2021 CLINICAL DATA:  Pulmonary embolus suspected with high probability. Shortness of breath with chest pain. EXAM: CT ANGIOGRAPHY CHEST WITH CONTRAST TECHNIQUE: Multidetector CT imaging of the chest was performed using the standard protocol during bolus administration of intravenous contrast. Multiplanar CT image reconstructions and MIPs were obtained to evaluate the vascular anatomy. RADIATION DOSE REDUCTION: This exam was performed according to the departmental dose-optimization program which includes automated exposure control, adjustment of the mA and/or kV according to patient size and/or use of iterative reconstruction technique. CONTRAST:  170m OMNIPAQUE IOHEXOL 350 MG/ML SOLN COMPARISON:  Chest radiograph 07/18/2021 FINDINGS: Cardiovascular: Moderately good opacification of the central and proximal segmental pulmonary arteries. No large central filling defect is identified suggesting no significant central pulmonary embolus although distal segmental  or subsegmental emboli could be obscured due to limitation of technique. Normal caliber thoracic aorta. No aortic dissection. Great vessel origins are patent. Normal heart size. No pericardial effusions. Coronary artery calcifications. Mediastinum/Nodes: Thyroid gland is unremarkable. Esophagus is decompressed. No significant lymphadenopathy. Lungs/Pleura: Lungs are clear. No pleural effusions. No pneumothorax. Upper Abdomen: No acute process demonstrated in the visualized upper abdomen. Left adrenal gland lesion measuring 3.3 x 4.6 cm containing macroscopic fat consistent with a myelolipoma. No follow-up is indicated in a typically benign lesion. Musculoskeletal: Degenerative changes in the spine. No destructive bone lesions. Review of the MIP images confirms the above findings. IMPRESSION: 1. No evidence of significant pulmonary embolus although poor contrast bolus limits evaluation of distal segmental and subsegmental pulmonary arteries. 2. Lungs are clear. 3. 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 Electronically Signed   By: WLucienne CapersM.D.   On: 07/18/2021 17:41   CT Cervical Spine Wo Contrast  Result Date: 07/18/2021 CLINICAL DATA:  Neck trauma, dangerous injury mechanism. Episode of loss of consciousness with a fall. EXAM: CT CERVICAL SPINE WITHOUT CONTRAST TECHNIQUE: Multidetector CT imaging of the cervical spine was performed without intravenous contrast. Multiplanar CT image reconstructions were also generated. RADIATION DOSE REDUCTION: This exam was performed according to the departmental dose-optimization program which includes automated exposure control, adjustment of the mA and/or kV according to patient size and/or use of iterative reconstruction technique. COMPARISON:  Cervical spine radiographs 10/09/2018 FINDINGS: Alignment: Straightening of usual cervical lordosis is likely positional and is unchanged since prior study. No anterior subluxations.  Normal alignment of the posterior elements. Skull base and vertebrae: Skull base appears intact. No vertebral compression deformities. No focal bone lesions. Soft tissues and spinal canal: Prominence of prevertebral soft tissues at CT 2-3, likely related to prominent adipose tissues. No abnormal paraspinal soft tissue mass or infiltration. Disc levels: Degenerative changes with disc space narrowing and endplate osteophyte formation throughout. Prominent anterior osteophytes. Upper chest: Visualized lung apices are clear. Other: Motion artifact and body habitus limit the examination. IMPRESSION: 1. Nonspecific straightening of usual cervical lordosis is likely positional. 2. Degenerative changes in the cervical spine. 3. Prominence of prevertebral soft tissues likely related to adipose tissues. No acute displaced fractures are identified. Electronically Signed   By: WLucienne CapersM.D.   On: 07/18/2021 17:34   DG Chest Portable 1 View  Result Date: 07/18/2021 CLINICAL DATA:  Shortness of breath EXAM: PORTABLE CHEST 1 VIEW COMPARISON:  10/09/2018 FINDINGS: Transverse diameter of heart is within normal limits. There are no signs of pulmonary edema. There is no focal pulmonary consolidation. Small linear  densities seen in the medial right lower lung fields. There is blunting of right lateral CP angle. There is no pneumothorax. IMPRESSION: Increased markings in the medial right lower lung fields may suggest subsegmental atelectasis/pneumonia. Blunting of right lateral CP angle suggests small effusion. Electronically Signed   By: Elmer Picker M.D.   On: 07/18/2021 16:44    ECHO 2017 LVEF 60-65%  TELEMETRY reviewed by me: reported episodes of ventricular bigemininy last night corresponding with fluttering sensation per patient, this morning - NSR rate of 80  EKG reviewed by me: NSR rate 80  ASSESSMENT AND PLAN:  The patient is a 77yoF with a past medical history significant for CAD s/p PCI in 2014,  HTN, Hyperlipidemia, CKD3a, type 2 diabetes, obesity s/p gastric bypass, OSA on CPAP, reported history of paroxysmal atrial fibrillation in the setting of COVID 19 infection, anxiety, who presented to [facility] ED 07/18/2021 after a syncopal episode proceeded by palpitations and epigastric pain radiating to her right arm. Cardiology is consulted for further evaluation of a cardiac cause of her symptoms.   #atypical chest pain #palpitations #syncope The patient reports being under significant stress lately due to her job and conflicts with her current roommate. She states yesterday at work she was feeling very overwhelmed and had palpitations, chest pressure, SOB, and passed out. She feels overall better today, denies recurrence of the chest pressure or SOB. Had transient palpitations this morning.  -troponin flat 9-7, EKG is without acute ST or T wave changes. Telemetry revealed possible PVCs vs artifact corresponding with her palpitations this morning. CTA negative for PE. No sinus pauses or high degree AV block noted on telemetry so far. She has known CAD and other risk factors, but it is likely her presentation is not a result of ACS.  - monitor on telemetry while inpatient - resume home carvediol at 6.45m BID (12.548mBID at home) to help with palpitations - hold losartan 254mor now for her low BP - repeat echocardiogram and will follow results today. If no significant change from prior, likely ok for discharge this afternoon with close follow up for consideration of further ischemic workup in 1-2 weeks with Dr. OrgCorky Sox- [facility] holter monitor before discharge.   #CAD with PCI 2014  #hypertension #hyperlipidemia -continue aspirin 25m22mily -continue high intensity atorvastatin 80mg68mly  This patient's plan of care was discussed and created with Dr. AlexaIsaias Cowmanhe is in agreement.  Signed: Kortez Murtagh Tristan Schroeder-C 07/19/2021, 8:20 AM [facility]

## 2021-07-19 NOTE — Progress Notes (Signed)
*  PRELIMINARY RESULTS* Echocardiogram 2D Echocardiogram has been performed.  Sherrie Sport 07/19/2021, 9:25 AM

## 2021-07-20 DIAGNOSIS — R55 Syncope and collapse: Secondary | ICD-10-CM | POA: Diagnosis not present

## 2021-07-20 LAB — CBC WITH DIFFERENTIAL/PLATELET
Abs Immature Granulocytes: 0.01 10*3/uL (ref 0.00–0.07)
Basophils Absolute: 0.1 10*3/uL (ref 0.0–0.1)
Basophils Relative: 1 %
Eosinophils Absolute: 0.1 10*3/uL (ref 0.0–0.5)
Eosinophils Relative: 1 %
HCT: 37.9 % (ref 36.0–46.0)
Hemoglobin: 12.4 g/dL (ref 12.0–15.0)
Immature Granulocytes: 0 %
Lymphocytes Relative: 21 %
Lymphs Abs: 1.5 10*3/uL (ref 0.7–4.0)
MCH: 29.5 pg (ref 26.0–34.0)
MCHC: 32.7 g/dL (ref 30.0–36.0)
MCV: 90 fL (ref 80.0–100.0)
Monocytes Absolute: 0.5 10*3/uL (ref 0.1–1.0)
Monocytes Relative: 7 %
Neutro Abs: 5 10*3/uL (ref 1.7–7.7)
Neutrophils Relative %: 70 %
Platelets: 209 10*3/uL (ref 150–400)
RBC: 4.21 MIL/uL (ref 3.87–5.11)
RDW: 14.2 % (ref 11.5–15.5)
WBC: 7.2 10*3/uL (ref 4.0–10.5)
nRBC: 0 % (ref 0.0–0.2)

## 2021-07-20 LAB — COMPREHENSIVE METABOLIC PANEL
ALT: 10 U/L (ref 0–44)
AST: 11 U/L — ABNORMAL LOW (ref 15–41)
Albumin: 3.3 g/dL — ABNORMAL LOW (ref 3.5–5.0)
Alkaline Phosphatase: 99 U/L (ref 38–126)
Anion gap: 7 (ref 5–15)
BUN: 19 mg/dL (ref 6–20)
CO2: 22 mmol/L (ref 22–32)
Calcium: 8.1 mg/dL — ABNORMAL LOW (ref 8.9–10.3)
Chloride: 107 mmol/L (ref 98–111)
Creatinine, Ser: 0.95 mg/dL (ref 0.44–1.00)
GFR, Estimated: >60 mL/min (ref >60)
Glucose, Bld: 178 mg/dL — ABNORMAL HIGH (ref 70–99)
Potassium: 4.3 mmol/L (ref 3.5–5.1)
Sodium: 136 mmol/L (ref 135–145)
Total Bilirubin: 0.7 mg/dL (ref 0.3–1.2)
Total Protein: 7 g/dL (ref 6.5–8.1)

## 2021-07-20 LAB — GLUCOSE, CAPILLARY
Glucose-Capillary: 185 mg/dL — ABNORMAL HIGH (ref 70–99)
Glucose-Capillary: 215 mg/dL — ABNORMAL HIGH (ref 70–99)

## 2021-07-20 MED ORDER — INSULIN ASPART 100 UNIT/ML IJ SOLN: 0.0000 [IU] | Freq: Three times a day (TID) | INTRAMUSCULAR | 11 refills | Status: AC

## 2021-07-20 MED ORDER — CARVEDILOL 3.125 MG PO TABS: 3.1250 mg | ORAL_TABLET | Freq: Two times a day (BID) | ORAL | 1 refills | Status: AC

## 2021-07-20 NOTE — Assessment & Plan Note (Signed)
Continue Celexa

## 2021-07-20 NOTE — Discharge Instructions (Signed)
F/u cardiology as instructed Check your sugars 3 times a day and follow the Sliding scale

## 2021-07-20 NOTE — Progress Notes (Addendum)
°  Transition of Care [facility]) Screening Note   Patient Details  Name: Tamara Meyer Date of Birth: 08-16-1965   Transition of Care [facility]) CM/SW Contact:    Alberteen Sam, LCSW Phone Number: 07/20/2021, 8:48 AM  Patient to discharge today, no needs identified.   Transition of Care Department [facility]) has reviewed patient and no TOC needs have been identified at this time. We will continue to monitor patient advancement through interdisciplinary progression rounds. If new patient transition needs arise, please place a TOC consult.  [place], [place]

## 2021-07-20 NOTE — Progress Notes (Signed)
Inpatient Diabetes Program Recommendations  AACE/ADA: New Consensus Statement on Inpatient Glycemic Control  Target Ranges:  Prepandial:   less than 140 mg/dL      Peak postprandial:   less than 180 mg/dL (1-2 hours)      Critically ill patients:  140 - 180 mg/dL    Latest Reference Range & Units 07/20/21 05:07 07/20/21 08:45  Glucose-Capillary 70 - 99 mg/dL 185 (H) 215 (H)    Latest Reference Range & Units 07/19/21 05:52 07/19/21 11:42 07/19/21 16:18 07/19/21 20:46  Glucose-Capillary 70 - 99 mg/dL 245 (H) 216 (H) 191 (H) 228 (H)   Review of Glycemic Control  Diabetes history: DM2 Outpatient Diabetes medications: Ozempic 1 mg Qweek (Tuesday), Tresiba 130 units daily, Xigduo 10-998 mg BID, Levemir 10-40 units if glucose is elevated Current orders for Inpatient glycemic control: Levemir 50 units QHS, Novolog 0-15 units TID with meals, Novolog 2 units TID with meals, Metformin 1000 mg BID, Farxiga 5 mg BID  NOTE: Patient to be discharged home today. Reviewed FreeStyle Libre2 CGM (discussed yesterday). Assisted patient to apply FreeStyle Libre2 CGM to back of right upper arm (applied at 9:25 am). Reviewed 1 hour warm up before glucose value would be provided from sensor. Encouraged patient to monitor glucose closely and have PCP review glucose data at follow up appointments. Also encouraged patient to follow up with PCP about referral to Endocrinologist. Patient has 2 additional sensors to use outpatient. Patient to have PCP prescribe FreeStyle Libre2 sensor going forward. Patient appreciative of information discussed and has no questions at this time.  Thanks, Barnie Alderman, RN, MSN, CDE Diabetes Coordinator Inpatient Diabetes Program [phone] (Team Pager from 8am to 5pm)

## 2021-07-20 NOTE — Discharge Summary (Addendum)
Physician Discharge Summary   Patient: Tamara Meyer MRN: [id number] DOB: 1966/04/12  Admit date:     07/18/2021  Discharge date: 07/20/21  Discharge Physician: Fritzi Mandes   PCP: Sallee Lange, NP    Discharge Diagnoses:  syncope suspected stress-related palpitations improved    Assessment and Plan: * Syncope and collapse Differential includes orthostatic hypotension, arrhythmia CTA ruled out PE, ACS work-up negative so far Syncope work-up to include continuous cardiac monitoring, echo Orthostatic checks Optimize electrolytes Increase home carvedilol to 25 twice daily Cardiology consult   Anxiety with Psychosocial stressors Psych consult was placed from the ED patient was seen by psychiatry nurse practitioner continue Celexa  Chronic kidney disease, stage 3a ([place]) Renal function for the most part at baseline  Morbid obesity with BMI of 50.0-59.9, adult ([place]) Complicating factor to overall prognosis and care recommended lifestyle changes.  OSA on CPAP CPAP nightly  Uncontrolled type 2 diabetes mellitus with hyperglycemia, with long-term current use of insulin (HCC) Blood sugars in the mid 200s and last A1c on record 8.5. Sliding scale insulin patient was seen by diabetes coordinator. Discontinue Levemir. Continue tresiba, and oral meds as before. Patient is placed on NovoLog flex pen.   Depression- (present on admission) Continue Celexa  Essential hypertension- (present on admission) Was borderline hypotensive on arrival -- continue Coreg and amlodipine. -- Losartan on hold for  CAD with history of coronary artery stent placement 2014 -Patient had mild chest/epigastric pain radiating to the right arm but troponins negative EKG nonacute -Negative nuclear stress test 2017 Echocardiogram EF 60 to 65%  --cardiology consult with Dr. Saralyn Pilar. Patient recommended Holter monitor. Cardiology recommends continue Coreg and amlodipine given palpitations. EKG  no acute changes. -- Patient will follow-up cardiology as outpatient. Denies any chest pain today feels overall better. Blood pressure much improved. On telemetry remains in sinus rhythm  Hypotension-resolved as of 07/20/2021 BP 105/55 on arrival, improving to 137/94 following fluid bolus Hold amlodipine and losartan until the a.m. We will continue carvedilol due to palpitations  Tachyarrhythmia, paroxysmal-resolved as of 07/20/2021 Concerning rhythm (bigeminy) seen on monitor while in the ED, not captured on EKG Obtain monitor strips Maintain magnesium over 2 and potassium over 4 Additional management as above   Hyponatremia-resolved as of 07/20/2021 Received an IV fluid bolus of NS in the ED Continue to monitor and correct as needed           Consultants: [facility] cardiology Procedures performed: Holter monitor Disposition: Home Diet recommendation:  Discharge Diet Orders (From admission, onward)     Start     Ordered   07/20/21 0000  Diet - low sodium heart healthy        07/20/21 0802           Cardiac and Carb modified diet  DISCHARGE MEDICATION: Allergies as of 07/20/2021       Reactions   Bee Venom Anaphylaxis   Meloxicam Hives   Diphenoxylate-atropine    Lisinopril Swelling, Other (See Comments)   Reaction: Throat closes   Lamotrigine Rash   Pineapple Itching   Itchy tongue - mild allergy reported Itchy tongue - mild allergy reported        Medication List     STOP taking these medications    insulin aspart 100 UNIT/ML FlexPen Commonly known as: NOVOLOG Replaced by: insulin aspart 100 UNIT/ML injection   insulin detemir 100 UNIT/ML FlexPen Commonly known as: LEVEMIR   losartan 25 MG tablet Commonly known as: COZAAR   nitrofurantoin (  macrocrystal-monohydrate) 100 MG capsule Commonly known as: Macrobid       TAKE these medications    albuterol 108 (90 Base) MCG/ACT inhaler Commonly known as: VENTOLIN HFA INL 2 PUFFS PO INTO THE LUNGS Q 4 H  PRF WHZ   albuterol (2.5 MG/3ML) 0.083% nebulizer solution Commonly known as: PROVENTIL Inhale into the lungs.   amLODipine 5 MG tablet Commonly known as: NORVASC Take 5 mg by mouth daily.   aspirin EC 81 MG tablet Take 81 mg by mouth daily. Swallow whole.   atorvastatin 80 MG tablet Commonly known as: LIPITOR Take 80 mg by mouth every evening.   azelastine 0.1 % nasal spray Commonly known as: ASTELIN Place 2 sprays into both nostrils 2 (two) times daily.   budesonide-formoterol 160-4.5 MCG/ACT inhaler Commonly known as: SYMBICORT Inhale into the lungs.   carvedilol 3.125 MG tablet Commonly known as: COREG Take 1 tablet (3.125 mg total) by mouth 2 (two) times daily with a meal. What changed:  medication strength how much to take   citalopram 40 MG tablet Commonly known as: CELEXA Take 1 tablet (40 mg total) by mouth daily.   clotrimazole-betamethasone cream Commonly known as: LOTRISONE Apply 1 application topically 2 (two) times daily.   Dapagliflozin-metFORMIN HCl ER 10-998 MG Tb24 Take 1 tablet by mouth 2 (two) times daily.   EPINEPHrine 0.3 mg/0.3 mL Soaj injection Commonly known as: EPI-PEN   fluticasone 50 MCG/ACT nasal spray Commonly known as: FLONASE Place into the nose.   gabapentin 300 MG capsule Commonly known as: NEURONTIN Take 300 mg by mouth 2 (two) times daily.   insulin aspart 100 UNIT/ML injection Commonly known as: novoLOG Inject 0-15 Units into the skin 3 (three) times daily with meals. Replaces: insulin aspart 100 UNIT/ML FlexPen   Insulin Pen Needle 30G X 8 MM Misc Commonly known as: NOVOFINE   montelukast 10 MG tablet Commonly known as: SINGULAIR Take 10 mg by mouth at bedtime.   OneTouch Delica Lancets Fine Misc Use 1 each 3 (three) times daily   Ozempic (1 MG/DOSE) 4 MG/3ML Sopn Generic drug: Semaglutide (1 MG/DOSE) Inject 1 mg into the skin once a week.   Tyler Aas FlexTouch 200 UNIT/ML FlexTouch Pen Generic drug: insulin  degludec Inject 130 Units into the skin daily.        Follow-up Information     Andrez Grime, MD. Go in 1 week(s).   Specialty: Cardiology Why: suyncope. pt has holter monitor Contact information: [place] [address] [phone]         Sallee Lange, NP. Schedule an appointment as soon as possible for a visit in 1 week(s).   Specialty: Internal Medicine Why: hosp f/u Contact information: [place] [address] [phone]                 Discharge Exam: Danley Danker Weights   07/18/21 1606  Weight: (!) 171.7 kg      Condition at discharge: stable  The results of significant diagnostics from this hospitalization (including imaging, microbiology, ancillary and laboratory) are listed below for reference.   Imaging Studies: CT HEAD WO CONTRAST (5MM)  Result Date: 07/18/2021 CLINICAL DATA:  Head trauma with abnormal mental status. Loss of consciousness for 10 seconds. EXAM: CT HEAD WITHOUT CONTRAST TECHNIQUE: Contiguous axial images were obtained from the base of the skull through the vertex without intravenous contrast. RADIATION DOSE REDUCTION: This exam was performed according to the  departmental dose-optimization program which includes automated exposure control, adjustment of the mA and/or kV according to patient size and/or use of iterative reconstruction technique. COMPARISON:  None. FINDINGS: Brain: No evidence of acute infarction, hemorrhage, hydrocephalus, extra-axial collection or mass lesion/mass effect. Mild cerebral atrophy. Old appearing lacunar infarct in the right basal ganglia. Vascular: Intracranial arterial vascular calcifications are present. Skull: Calvarium appears intact. Sinuses/Orbits: Paranasal sinuses and mastoid air cells are clear. Other: Streak artifact limits the technical quality of the examination. IMPRESSION: No acute intracranial abnormalities. Mild  chronic atrophy. Old lacunar infarcts. Electronically Signed   By: Lucienne Capers M.D.   On: 07/18/2021 17:37   CT Angio Chest PE W and/or Wo Contrast  Result Date: 07/18/2021 CLINICAL DATA:  Pulmonary embolus suspected with high probability. Shortness of breath with chest pain. EXAM: CT ANGIOGRAPHY CHEST WITH CONTRAST TECHNIQUE: Multidetector CT imaging of the chest was performed using the standard protocol during bolus administration of intravenous contrast. Multiplanar CT image reconstructions and MIPs were obtained to evaluate the vascular anatomy. RADIATION DOSE REDUCTION: This exam was performed according to the departmental dose-optimization program which includes automated exposure control, adjustment of the mA and/or kV according to patient size and/or use of iterative reconstruction technique. CONTRAST:  159m OMNIPAQUE IOHEXOL 350 MG/ML SOLN COMPARISON:  Chest radiograph 07/18/2021 FINDINGS: Cardiovascular: Moderately good opacification of the central and proximal segmental pulmonary arteries. No large central filling defect is identified suggesting no significant central pulmonary embolus although distal segmental or subsegmental emboli could be obscured due to limitation of technique. Normal caliber thoracic aorta. No aortic dissection. Great vessel origins are patent. Normal heart size. No pericardial effusions. Coronary artery calcifications. Mediastinum/Nodes: Thyroid gland is unremarkable. Esophagus is decompressed. No significant lymphadenopathy. Lungs/Pleura: Lungs are clear. No pleural effusions. No pneumothorax. Upper Abdomen: No acute process demonstrated in the visualized upper abdomen. Left adrenal gland lesion measuring 3.3 x 4.6 cm containing macroscopic fat consistent with a myelolipoma. No follow-up is indicated in a typically benign lesion. Musculoskeletal: Degenerative changes in the spine. No destructive bone lesions. Review of the MIP images confirms the above findings.  IMPRESSION: 1. No evidence of significant pulmonary embolus although poor contrast bolus limits evaluation of distal segmental and subsegmental pulmonary arteries. 2. Lungs are clear. 3. 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 Electronically Signed   By: WLucienne CapersM.D.   On: 07/18/2021 17:41   CT Cervical Spine Wo Contrast  Result Date: 07/18/2021 CLINICAL DATA:  Neck trauma, dangerous injury mechanism. Episode of loss of consciousness with a fall. EXAM: CT CERVICAL SPINE WITHOUT CONTRAST TECHNIQUE: Multidetector CT imaging of the cervical spine was performed without intravenous contrast. Multiplanar CT image reconstructions were also generated. RADIATION DOSE REDUCTION: This exam was performed according to the departmental dose-optimization program which includes automated exposure control, adjustment of the mA and/or kV according to patient size and/or use of iterative reconstruction technique. COMPARISON:  Cervical spine radiographs 10/09/2018 FINDINGS: Alignment: Straightening of usual cervical lordosis is likely positional and is unchanged since prior study. No anterior subluxations. Normal alignment of the posterior elements. Skull base and vertebrae: Skull base appears intact. No vertebral compression deformities. No focal bone lesions. Soft tissues and spinal canal: Prominence of prevertebral soft tissues at CT 2-3, likely related to prominent adipose tissues. No abnormal paraspinal soft tissue mass or infiltration. Disc levels: Degenerative changes with disc space narrowing and endplate osteophyte formation throughout. Prominent anterior osteophytes. Upper chest: Visualized lung apices are clear. Other: Motion artifact and body  habitus limit the examination. IMPRESSION: 1. Nonspecific straightening of usual cervical lordosis is likely positional. 2. Degenerative changes in the cervical spine. 3. Prominence of prevertebral soft tissues likely related to  adipose tissues. No acute displaced fractures are identified. Electronically Signed   By: Lucienne Capers M.D.   On: 07/18/2021 17:34   DG Chest Portable 1 View  Result Date: 07/18/2021 CLINICAL DATA:  Shortness of breath EXAM: PORTABLE CHEST 1 VIEW COMPARISON:  10/09/2018 FINDINGS: Transverse diameter of heart is within normal limits. There are no signs of pulmonary edema. There is no focal pulmonary consolidation. Small linear densities seen in the medial right lower lung fields. There is blunting of right lateral CP angle. There is no pneumothorax. IMPRESSION: Increased markings in the medial right lower lung fields may suggest subsegmental atelectasis/pneumonia. Blunting of right lateral CP angle suggests small effusion. Electronically Signed   By: Elmer Picker M.D.   On: 07/18/2021 16:44   ECHOCARDIOGRAM COMPLETE  Result Date: 07/19/2021    ECHOCARDIOGRAM REPORT   Patient Name:   Tamara Meyer Date of Exam: 07/19/2021 Medical Rec #:  [id number]        Height:       70.0 in Accession #:    [id number]       Weight:       378.5 lb Date of Birth:  1966-02-28        BSA:          2.737 m Patient Age:    22 years         BP:           105/64 mmHg Patient Gender: F                HR:           75 bpm. Exam Location:  [facility] Procedure: 2D Echo, Cardiac Doppler and Color Doppler Indications:     Syncope R55  History:         Patient has prior history of Echocardiogram examinations, most                  recent 04/27/2016. Risk Factors:Diabetes and Hypertension.                  Anxiety.  Sonographer:     Sherrie Sport Referring Phys:  [id number] Athena Masse Diagnosing Phys: Nelva Bush MD  Sonographer Comments: Technically challenging study due to limited acoustic windows, no apical window and no subcostal window. IMPRESSIONS  1. Left ventricular ejection fraction, by estimation, is 60 to 65%. The left ventricle has normal function. Left ventricular endocardial border not optimally defined to evaluate  regional wall motion. Left ventricular diastolic function could not be evaluated.  2. Right ventricular systolic function is normal. The right ventricular size is not well visualized. Mildly increased right ventricular wall thickness.  3. The mitral valve is grossly normal. Unable to accurately assess mitral valve regurgitation.  4. The aortic valve was not well visualized. Aortic valve regurgitation not well assessed. Aortic valve gradient could not be assessed. FINDINGS  Left Ventricle: Left ventricular ejection fraction, by estimation, is 60 to 65%. The left ventricle has normal function. Left ventricular endocardial border not optimally defined to evaluate regional wall motion. The left ventricular internal cavity size was normal in size. There is no left ventricular hypertrophy. Left ventricular diastolic function could not be evaluated. Right Ventricle: The right ventricular size is not well visualized. Mildly increased right ventricular wall thickness. Right  ventricular systolic function is normal. Left Atrium: Left atrial size was not well visualized. Right Atrium: Right atrial size was not well visualized. Pericardium: The pericardium was not well visualized. Mitral Valve: The mitral valve is grossly normal. Unable to accurately assess mitral valve regurgitation. Tricuspid Valve: The tricuspid valve is not well visualized. Tricuspid valve regurgitation is not demonstrated. Aortic Valve: The aortic valve was not well visualized. Aortic valve regurgitation not well assessed. Aortic valve gradient could not be assessed. Pulmonic Valve: The pulmonic valve was not well visualized. Pulmonic valve regurgitation is not visualized. No evidence of pulmonic stenosis. Aorta: The aortic root is normal in size and structure. Pulmonary Artery: The pulmonary artery is not well seen. Venous: The inferior vena cava was not well visualized. IAS/Shunts: The interatrial septum was not well visualized.  LEFT VENTRICLE PLAX 2D  LVIDd:         4.90 cm LVIDs:         3.10 cm LV PW:         1.30 cm LV IVS:        1.20 cm LVOT diam:     2.00 cm LVOT Area:     3.14 cm  LEFT ATRIUM         Index LA diam:    4.30 cm 1.57 cm/m                        PULMONIC VALVE AORTA                 PV Vmax:        0.84 m/s Ao Root diam: 2.60 cm PV Vmean:       50.150 cm/s                       PV VTI:         0.149 m                       PV Peak grad:   2.8 mmHg                       PV Mean grad:   1.0 mmHg                       RVOT Peak grad: 3 mmHg   SHUNTS Systemic Diam: 2.00 cm Pulmonic VTI:  0.153 m Nelva Bush MD Electronically signed by Nelva Bush MD Signature Date/Time: 07/19/2021/10:11:30 AM    Final     Microbiology: Results for orders placed or performed during the hospital encounter of 07/18/21  Resp Panel by RT-PCR (Flu A&B, Covid) Nasopharyngeal Swab     Status: None   Collection Time: 07/18/21  7:59 PM   Specimen: Nasopharyngeal Swab; Nasopharyngeal(NP) swabs in vial transport medium  Result Value Ref Range Status   SARS Coronavirus 2 by RT PCR NEGATIVE NEGATIVE Final    Comment: (NOTE) SARS-CoV-2 target nucleic acids are NOT DETECTED.  The SARS-CoV-2 RNA is generally detectable in upper respiratory specimens during the acute phase of infection. The lowest concentration of SARS-CoV-2 viral copies this assay can detect is 138 copies/mL. A negative result does not preclude SARS-Cov-2 infection and should not be used as the sole basis for treatment or other patient management decisions. A negative result may occur with  improper specimen collection/handling, submission of specimen other than nasopharyngeal swab, presence of viral mutation(s) within  the areas targeted by this assay, and inadequate number of viral copies(<138 copies/mL). A negative result must be combined with clinical observations, patient history, and epidemiological information. The expected result is Negative.  Fact Sheet for Patients:   EntrepreneurPulse.com.au  Fact Sheet for Healthcare Providers:  IncredibleEmployment.be  This test is no t yet approved or cleared by the [place] FDA and  has been authorized for detection and/or diagnosis of SARS-CoV-2 by FDA under an Emergency Use Authorization (EUA). This EUA will remain  in effect (meaning this test can be used) for the duration of the COVID-19 declaration under Section 564(b)(1) of the Act, 21 U.S.C.section 360bbb-3(b)(1), unless the authorization is terminated  or revoked sooner.       Influenza A by PCR NEGATIVE NEGATIVE Final   Influenza B by PCR NEGATIVE NEGATIVE Final    Comment: (NOTE) The Xpert Xpress SARS-CoV-2/FLU/RSV plus assay is intended as an aid in the diagnosis of influenza from Nasopharyngeal swab specimens and should not be used as a sole basis for treatment. Nasal washings and aspirates are unacceptable for Xpert Xpress SARS-CoV-2/FLU/RSV testing.  Fact Sheet for Patients: EntrepreneurPulse.com.au  Fact Sheet for Healthcare Providers: IncredibleEmployment.be  This test is not yet approved or cleared by the [place] FDA and has been authorized for detection and/or diagnosis of SARS-CoV-2 by FDA under an Emergency Use Authorization (EUA). This EUA will remain in effect (meaning this test can be used) for the duration of the COVID-19 declaration under Section 564(b)(1) of the Act, 21 U.S.C. section 360bbb-3(b)(1), unless the authorization is terminated or revoked.  Performed at [facility], [address] [place], [place] [address]     Labs: CBC: Recent Labs  Lab 07/18/21 1621  WBC 9.0  NEUTROABS 6.6  HGB 12.8  HCT 39.1  MCV 90.7  PLT 268   Basic Metabolic Panel: Recent Labs  Lab 07/18/21 1621 07/18/21 1845 07/19/21 0635  NA 131*  --  135  K 3.6  --  4.3  CL 99  --  105  CO2 24  --  24  GLUCOSE 249*  --  246*  BUN 15  --   19  CREATININE 1.16*  --  1.24*  CALCIUM 8.2*  --  8.4*  MG  --  1.9 2.1   Liver Function Tests: Recent Labs  Lab 07/18/21 1621  AST 15  ALT 11  ALKPHOS 108  BILITOT 0.6  PROT 7.1  ALBUMIN 3.5   CBG: Recent Labs  Lab 07/19/21 0552 07/19/21 1142 07/19/21 1618 07/19/21 2046 07/20/21 0507  GLUCAP 245* 216* 191* 228* 185*    Discharge time spent: greater than 30 minutes.  Signed: Fritzi Mandes, MD [facility] 07/20/2021

## 2021-07-22 ENCOUNTER — Other Ambulatory Visit: Payer: Self-pay | Admitting: Nurse Practitioner

## 2021-07-22 DIAGNOSIS — Z1231 Encounter for screening mammogram for malignant neoplasm of breast: Secondary | ICD-10-CM

## 2021-07-25 ENCOUNTER — Ambulatory Visit
Admission: RE | Admit: 2021-07-25 | Discharge: 2021-07-25 | Disposition: A | Discharge status: Home / Self Care | Payer: Medicare HMO | Source: Ambulatory Visit

## 2021-07-25 ENCOUNTER — Other Ambulatory Visit: Payer: Self-pay

## 2021-07-25 DIAGNOSIS — Z1231 Encounter for screening mammogram for malignant neoplasm of breast: Secondary | ICD-10-CM

## 2021-07-27 ENCOUNTER — Other Ambulatory Visit: Payer: Self-pay | Admitting: Nurse Practitioner

## 2021-07-27 DIAGNOSIS — R928 Other abnormal and inconclusive findings on diagnostic imaging of breast: Secondary | ICD-10-CM

## 2021-08-02 ENCOUNTER — Ambulatory Visit
Admission: RE | Admit: 2021-08-02 | Discharge: 2021-08-02 | Disposition: A | Discharge status: Home / Self Care | Payer: Medicare HMO | Source: Ambulatory Visit | Attending: Nurse Practitioner | Admitting: Nurse Practitioner

## 2021-08-02 DIAGNOSIS — R928 Other abnormal and inconclusive findings on diagnostic imaging of breast: Secondary | ICD-10-CM

## 2021-12-12 ENCOUNTER — Other Ambulatory Visit: Payer: Self-pay

## 2021-12-12 ENCOUNTER — Emergency Department
Admission: EM | Admit: 2021-12-12 | Discharge: 2021-12-13 | Disposition: A | Discharge status: Home / Self Care | Payer: Medicare HMO | Attending: Emergency Medicine | Admitting: Emergency Medicine

## 2021-12-12 ENCOUNTER — Emergency Department: Payer: Medicare HMO

## 2021-12-12 DIAGNOSIS — E119 Type 2 diabetes mellitus without complications: Secondary | ICD-10-CM | POA: Insufficient documentation

## 2021-12-12 DIAGNOSIS — N83202 Unspecified ovarian cyst, left side: Secondary | ICD-10-CM

## 2021-12-12 DIAGNOSIS — N183 Chronic kidney disease, stage 3 unspecified: Secondary | ICD-10-CM | POA: Insufficient documentation

## 2021-12-12 DIAGNOSIS — J449 Chronic obstructive pulmonary disease, unspecified: Secondary | ICD-10-CM | POA: Diagnosis not present

## 2021-12-12 DIAGNOSIS — I2510 Atherosclerotic heart disease of native coronary artery without angina pectoris: Secondary | ICD-10-CM | POA: Insufficient documentation

## 2021-12-12 DIAGNOSIS — D1779 Benign lipomatous neoplasm of other sites: Secondary | ICD-10-CM | POA: Insufficient documentation

## 2021-12-12 DIAGNOSIS — R300 Dysuria: Secondary | ICD-10-CM | POA: Diagnosis present

## 2021-12-12 LAB — URINALYSIS, ROUTINE W REFLEX MICROSCOPIC
Bilirubin Urine: NEGATIVE
Glucose, UA: >=500 mg/dL — AB
Hgb urine dipstick: NEGATIVE
Ketones, ur: NEGATIVE mg/dL
Leukocytes,Ua: NEGATIVE
Nitrite: NEGATIVE
Protein, ur: NEGATIVE mg/dL
Specific Gravity, Urine: 1.021 (ref 1.005–1.030)
pH: 5 (ref 5.0–8.0)

## 2021-12-12 LAB — CBC WITH DIFFERENTIAL/PLATELET
Abs Immature Granulocytes: 0.03 10*3/uL (ref 0.00–0.07)
Basophils Absolute: 0 10*3/uL (ref 0.0–0.1)
Basophils Relative: 0 %
Eosinophils Absolute: 0.1 10*3/uL (ref 0.0–0.5)
Eosinophils Relative: 1 %
HCT: 37.1 % (ref 36.0–46.0)
Hemoglobin: 12.1 g/dL (ref 12.0–15.0)
Immature Granulocytes: 0 %
Lymphocytes Relative: 19 %
Lymphs Abs: 1.6 10*3/uL (ref 0.7–4.0)
MCH: 29.7 pg (ref 26.0–34.0)
MCHC: 32.6 g/dL (ref 30.0–36.0)
MCV: 91.2 fL (ref 80.0–100.0)
Monocytes Absolute: 0.5 10*3/uL (ref 0.1–1.0)
Monocytes Relative: 6 %
Neutro Abs: 6.2 10*3/uL (ref 1.7–7.7)
Neutrophils Relative %: 74 %
Platelets: 223 10*3/uL (ref 150–400)
RBC: 4.07 MIL/uL (ref 3.87–5.11)
RDW: 13.9 % (ref 11.5–15.5)
WBC: 8.4 10*3/uL (ref 4.0–10.5)
nRBC: 0 % (ref 0.0–0.2)

## 2021-12-12 LAB — COMPREHENSIVE METABOLIC PANEL
ALT: 10 U/L (ref 0–44)
AST: 14 U/L — ABNORMAL LOW (ref 15–41)
Albumin: 3.2 g/dL — ABNORMAL LOW (ref 3.5–5.0)
Alkaline Phosphatase: 114 U/L (ref 38–126)
Anion gap: 9 (ref 5–15)
BUN: 20 mg/dL (ref 6–20)
CO2: 23 mmol/L (ref 22–32)
Calcium: 8.4 mg/dL — ABNORMAL LOW (ref 8.9–10.3)
Chloride: 105 mmol/L (ref 98–111)
Creatinine, Ser: 1.53 mg/dL — ABNORMAL HIGH (ref 0.44–1.00)
GFR, Estimated: 40 mL/min — ABNORMAL LOW (ref >60)
Glucose, Bld: 299 mg/dL — ABNORMAL HIGH (ref 70–99)
Potassium: 3.7 mmol/L (ref 3.5–5.1)
Sodium: 137 mmol/L (ref 135–145)
Total Bilirubin: 0.6 mg/dL (ref 0.3–1.2)
Total Protein: 6.9 g/dL (ref 6.5–8.1)

## 2021-12-12 LAB — PREGNANCY, URINE: Preg Test, Ur: NEGATIVE

## 2021-12-12 LAB — BASIC METABOLIC PANEL
Anion gap: 6 (ref 5–15)
BUN: 18 mg/dL (ref 6–20)
CO2: 24 mmol/L (ref 22–32)
Calcium: 8.2 mg/dL — ABNORMAL LOW (ref 8.9–10.3)
Chloride: 108 mmol/L (ref 98–111)
Creatinine, Ser: 1.29 mg/dL — ABNORMAL HIGH (ref 0.44–1.00)
GFR, Estimated: 50 mL/min — ABNORMAL LOW (ref >60)
Glucose, Bld: 175 mg/dL — ABNORMAL HIGH (ref 70–99)
Potassium: 3.6 mmol/L (ref 3.5–5.1)
Sodium: 138 mmol/L (ref 135–145)

## 2021-12-12 MED ORDER — SODIUM CHLORIDE 0.9 % IV BOLUS
1000.0000 mL | Freq: Once | INTRAVENOUS | Status: AC
  Administered 2021-12-12: 1000 mL via INTRAVENOUS

## 2021-12-12 MED ORDER — SULFAMETHOXAZOLE-TRIMETHOPRIM 800-160 MG PO TABS: 1.0000 | ORAL_TABLET | Freq: Once | ORAL | Status: DC

## 2021-12-12 NOTE — ED Provider Notes (Signed)
[facility] Provider Note    Event Date/Time   First MD Initiated Contact with Patient 12/12/21 1645     (approximate)   History   Chief Complaint UTI   HPI Tamara Meyer is a 56 y.o. female, history of hyperlipidemia, CAD, depression, type 2 diabetes, anxiety, CKD stage III, COPD, presents emergency department for evaluation of suspected UTI.  Patient reports dysuria and pelvic discomfort that started approximately 1 week ago.  In addition reports strange smell to her urine and mild pain in her left flank.  She was reportedly treated with nitrofurantoin, which she says resolved her symptoms temporarily, however the symptoms are now back.  Denies fever/chills, chest pain, shortness of breath, abdominal pain, nausea/vomiting, diarrhea, headache, dizziness/lightheadedness, vaginal bleeding/discharge, or rash/lesions  History Limitations: No limitations.        Physical Exam  Triage Vital Signs: ED Triage Vitals  Enc Vitals Group     BP 12/12/21 1634 (!) 135/59     Pulse Rate 12/12/21 1634 94     Resp 12/12/21 1634 17     Temp 12/12/21 1634 99.6 F (37.6 C)     Temp Source 12/12/21 1634 Oral     SpO2 12/12/21 1634 94 %     Weight 12/12/21 1644 (!) 378 lb 8.5 oz (171.7 kg)     Height 12/12/21 1644 5' 10"  (1.778 m)     Head Circumference --      Peak Flow --      Pain Score 12/12/21 1632 9     Pain Loc --      Pain Edu? --      Excl. in [place]? --     Most recent vital signs: Vitals:   12/12/21 1634 12/13/21 0000  BP: (!) 135/59 122/67  Pulse: 94 81  Resp: 17 18  Temp: 99.6 F (37.6 C)   SpO2: 94% 97%    General: Awake, NAD.  Skin: Warm, dry. No rashes or lesions.  Eyes: PERRL. Conjunctivae normal.  CV: Good peripheral perfusion.  Resp: Normal effort.  Abd: Soft, non-tender. No distention.  No obvious masses. Neuro: At baseline. No gross neurological deficits.   Focused Exam: No CVA tenderness bilaterally.  Physical Exam    ED  Results / Procedures / Treatments  Labs (all labs ordered are listed, but only abnormal results are displayed) Labs Reviewed  URINALYSIS, ROUTINE W REFLEX MICROSCOPIC - Abnormal; Notable for the following components:      Result Value   Color, Urine YELLOW (*)    APPearance CLEAR (*)    Glucose, UA >=500 (*)    Bacteria, UA RARE (*)    All other components within normal limits  COMPREHENSIVE METABOLIC PANEL - Abnormal; Notable for the following components:   Glucose, Bld 299 (*)    Creatinine, Ser 1.53 (*)    Calcium 8.4 (*)    Albumin 3.2 (*)    AST 14 (*)    GFR, Estimated 40 (*)    All other components within normal limits  BASIC METABOLIC PANEL - Abnormal; Notable for the following components:   Glucose, Bld 175 (*)    Creatinine, Ser 1.29 (*)    Calcium 8.2 (*)    GFR, Estimated 50 (*)    All other components within normal limits  CBC WITH DIFFERENTIAL/PLATELET  PREGNANCY, URINE     EKG N/A.   RADIOLOGY  ED Provider Interpretation: I personally viewed and interpreted these images.  CT renal stone shows large  ovarian mass.   US PELVIC COMPLETE W TRANSVAGINAL AND TORSION R/O  Result Date: 12/12/2021 CLINICAL DATA:  Left ovarian mass patient states that she is premenopausal EXAM: TRANSABDOMINAL AND TRANSVAGINAL ULTRASOUND OF PELVIS DOPPLER ULTRASOUND OF OVARIES TECHNIQUE: Both transabdominal and transvaginal ultrasound examinations of the pelvis were performed. Transabdominal technique was performed for global imaging of the pelvis including uterus, ovaries, adnexal regions, and pelvic cul-de-sac. It was necessary to proceed with endovaginal exam following the transabdominal exam to visualize the uterus endometrium ovaries. Color and duplex Doppler ultrasound was utilized to evaluate blood flow to the ovaries. COMPARISON:  CT 12/12/2021 FINDINGS: Uterus Measurements: 12.7 x 5.7 x 6 cm = volume: 227 mL. Multiple uterine fibroids posterior submucosal fibroid measuring 3.1 x 2.6  x 2.8 cm. Anterior subserosal lower uterine segment mass measuring 1.8 x 1.5 x 1.5 cm. Endometrium Thickness: 22.8 mm.  IUD within the upper uterine segment. Right ovary Not seen Left ovary Measurements: 8.7 x 4.8 x 4.3 cm = volume: 94.7 mL. Essentially replaced by adnexal cystic lesion. There is flow present at the periphery. Cystic lesion measures 9.5 x 6.6 by 6.2 cm. It contains at least 1 thin septation. Pulsed Doppler evaluation of both ovaries demonstrates normal low-resistance arterial and venous waveforms. Other findings No abnormal free fluid. IMPRESSION: 1. Nonvisualized right ovary 2. The left ovary is essentially replaced by a septated cystic adnexal lesion measuring up to 9.5 cm. Surgical consultation is recommended. 3. Endometrial thickness of 22.8 mm. Endometrial thickness is considered abnormal. Consider follow-up by [place] in 6-8 weeks, during the week immediately following menses (exam timing is critical). 4. Uterine fibroids Electronically Signed   By: Donavan Foil M.D.   On: 12/12/2021 23:32   CT Renal Stone Study  Result Date: 12/12/2021 CLINICAL DATA:  Nephrolithiasis.  UTI. EXAM: CT ABDOMEN AND PELVIS WITHOUT CONTRAST TECHNIQUE: Multidetector CT imaging of the abdomen and pelvis was performed following the standard protocol without IV contrast. RADIATION DOSE REDUCTION: This exam was performed according to the departmental dose-optimization program which includes automated exposure control, adjustment of the mA and/or kV according to patient size and/or use of iterative reconstruction technique. COMPARISON:  05/07/2017 FINDINGS: Lower chest: Lung bases are clear except for minimal linear scarring. Hepatobiliary: Liver parenchyma is normal.  No calcified gallstones. Pancreas: Normal Spleen: Normal Adrenals/Urinary Tract: Enlargement of the back containing mass of the left adrenal gland consistent with myelolipoma. Overall measurements today are 5.1 x 3.9 x 3.7 cm compared with 3.6 x 2.8 x 2.5  cm previously. No stone is seen in either kidney. No hydroureteronephrosis. No stone in the bladder. Stomach/Bowel: Stomach and small intestine are normal. No colon pathology. Vascular/Lymphatic: Aorta and IVC are normal.  No adenopathy. Reproductive: IUD in the uterus. Septated cystic abnormality the left ovary measuring 9.3 x 5.0 x 5.1 cm, enlarged since the previous study. Other: No free fluid or air. Musculoskeletal: Ordinary lumbar degenerative changes. IMPRESSION: No CT evidence of urinary tract infection or obstruction. No stone disease. Enlarging myelolipoma the left adrenal gland. Enlarging cystic abnormality of the left adnexa, possibly septated. Overall measurements today are 9.3 x 5.0 x 5.1 cm. Because this lesion is not adequately characterized, prompt [place] is recommended for further evaluation. Note: This recommendation does not apply to premenarchal patients and to those with increased risk (genetic, family history, elevated tumor markers or other high-risk factors) of ovarian cancer. Reference: JACR 2020 Feb; 17(2):248-254 Electronically Signed   By: Nelson Chimes M.D.   On: 12/12/2021 19:08  PROCEDURES:  Critical Care performed: N/A.  Procedures    MEDICATIONS ORDERED IN ED: Medications  sodium chloride 0.9 % bolus 1,000 mL (0 mLs Intravenous Stopped 12/12/21 2339)     IMPRESSION / MDM / ASSESSMENT AND PLAN / ED COURSE  I reviewed the triage vital signs and the nursing notes.                              Differential diagnosis includes, but is not limited to, cystitis, pyelonephritis, ureterolithiasis, ovarian cyst, ovarian torsion.   ED Course Patient appears well, vitals within normal limits.  NAD.  CBC shows no leukocytosis or anemia.  CMP notable for elevated glucose at 299 and elevated creatinine at 1.53 (1.24 approximately 4 months prior).  Will initiate IV fluids.  Urinalysis shows elevated glucose and rare bacteria, but otherwise no significant evidence of urinary  tract infection.  Repeat BMP shows improvement in creatinine and glucose following IV fluids.  Assessment/Plan Patient presents with symptoms of dysuria, pelvic discomfort x1 week.  No evidence of ureterolithiasis or infection, however pelvic ultrasound does reveal a large cystic mass measuring 9.3 x 5.0 x 5.1 cm.  Additionally noted significant thickening along the endometrium.  Spoke with on-call OB/GYN, Joycelyn Schmid prior, who advised that no immediate intervention was needed, however it was urgent that the patient follow-up with OB/GYN outpatient quickly.  No evidence of infection on urinalysis.  Lab work-up initially showed AKI and hyperglycemia, which improved with IV fluid bolus.  Patient otherwise appears clinically well.  We will provide her with a short-term prescription for oxycodone/acetaminophen for pain control.  Stressed the importance of quick OB/GYN follow-up.  Additionally notified her of the incidental finding of myolipoma on the left adrenal gland and encouraged her to follow-up with her primary care provider as needed.  We will plan to discharge.  Patient's presentation is most consistent with acute complicated illness / injury requiring diagnostic workup.   Considered admission for this patient, but given her stable presentation and close follow-up, she is unlikely to benefit from admission.  Provided the patient with anticipatory guidance, return precautions, and educational material. Encouraged the patient to return to the emergency department at any time if they begin to experience any new or worsening symptoms. Patient expressed understanding and agreed with the plan.       FINAL CLINICAL IMPRESSION(S) / ED DIAGNOSES   Final diagnoses:  Cyst of left ovary  Myelolipoma of left adrenal gland     Rx / DC Orders   ED Discharge Orders          Ordered    oxyCODONE-acetaminophen (PERCOCET/ROXICET) 5-325 MG tablet  Every 8 hours PRN        12/13/21 0001              Note:  This document was prepared using Dragon voice recognition software and may include unintentional dictation errors.   Teodoro Spray, [place] 12/13/21 [place]    Duffy Bruce, MD 12/14/21 1104

## 2021-12-12 NOTE — ED Notes (Signed)
See triage note  presents with lower back pain and urinary freq and dysuria  states she just finished antibiotics   States discomfort is worse

## 2021-12-12 NOTE — ED Triage Notes (Signed)
Pt comes with c/o UTI. Pt states symptoms started week ago. Pt was placed on medications and completed course. Pt states she feels worse.

## 2021-12-12 NOTE — Discharge Instructions (Addendum)
-  Please follow up w/ the OB GYN provider (Dr. Amalia Hailey) listed. It is very important that you see them soon.   -Follow up with your primary care provider in regards to the incidental findings on your CT scan, as discussed.  -Take tylenol/ibuprofen as needed for pain. Utilize oxycodone sparingly.   -Return to the emergency department at any time if you begin to experience any new or worsening symptoms.

## 2021-12-12 NOTE — ED Provider Notes (Incomplete)
[facility] Provider Note    Event Date/Time   First MD Initiated Contact with Patient 12/12/21 1645     (approximate)   History   Chief Complaint UTI   HPI Tamara Meyer is a 56 y.o. female, history of hyperlipidemia, CAD, depression, type 2 diabetes, anxiety, CKD stage III, COPD, presents emergency department for evaluation of suspected UTI.  Patient reports dysuria and pelvic discomfort that started approximately 1 week ago.  In addition reports strange smell to her urine and mild pain in her flanks bilaterally.  She was reportedly treated with nitrofurantoin, which she says resolved her symptoms temporarily, however the symptoms are now back.  Denies fever/chills, chest pain, shortness of breath, abdominal pain, nausea/vomiting, diarrhea, headache, dizziness/lightheadedness, vaginal bleeding/discharge, or rash/lesions  History Limitations: No limitations.        Physical Exam  Triage Vital Signs: ED Triage Vitals  Enc Vitals Group     BP 12/12/21 1634 (!) 135/59     Pulse Rate 12/12/21 1634 94     Resp 12/12/21 1634 17     Temp 12/12/21 1634 99.6 F (37.6 C)     Temp Source 12/12/21 1634 Oral     SpO2 12/12/21 1634 94 %     Weight 12/12/21 1644 (!) 378 lb 8.5 oz (171.7 kg)     Height 12/12/21 1644 5' 10"  (1.778 m)     Head Circumference --      Peak Flow --      Pain Score 12/12/21 1632 9     Pain Loc --      Pain Edu? --      Excl. in [place]? --     Most recent vital signs: Vitals:   12/12/21 1634  BP: (!) 135/59  Pulse: 94  Resp: 17  Temp: 99.6 F (37.6 C)  SpO2: 94%    General: Awake, NAD.  Skin: Warm, dry. No rashes or lesions.  Eyes: PERRL. Conjunctivae normal.  CV: Good peripheral perfusion.  Resp: Normal effort.  Abd: Soft, non-tender. No distention.  No obvious masses. Neuro: At baseline. No gross neurological deficits.   Focused Exam: No CVA tenderness bilaterally.  Physical Exam    ED Results / Procedures /  Treatments  Labs (all labs ordered are listed, but only abnormal results are displayed) Labs Reviewed  URINALYSIS, ROUTINE W REFLEX MICROSCOPIC - Abnormal; Notable for the following components:      Result Value   Color, Urine YELLOW (*)    APPearance CLEAR (*)    Glucose, UA >=500 (*)    Bacteria, UA RARE (*)    All other components within normal limits  COMPREHENSIVE METABOLIC PANEL - Abnormal; Notable for the following components:   Glucose, Bld 299 (*)    Creatinine, Ser 1.53 (*)    Calcium 8.4 (*)    Albumin 3.2 (*)    AST 14 (*)    GFR, Estimated 40 (*)    All other components within normal limits  BASIC METABOLIC PANEL - Abnormal; Notable for the following components:   Glucose, Bld 175 (*)    Creatinine, Ser 1.29 (*)    Calcium 8.2 (*)    GFR, Estimated 50 (*)    All other components within normal limits  CBC WITH DIFFERENTIAL/PLATELET  PREGNANCY, URINE     EKG N/A.   RADIOLOGY  ED Provider Interpretation: I personally viewed and interpreted these images.  CT renal stone shows large ovarian mass.   US PELVIC COMPLETE  W TRANSVAGINAL AND TORSION R/O  Result Date: 12/12/2021 CLINICAL DATA:  Left ovarian mass patient states that she is premenopausal EXAM: TRANSABDOMINAL AND TRANSVAGINAL ULTRASOUND OF PELVIS DOPPLER ULTRASOUND OF OVARIES TECHNIQUE: Both transabdominal and transvaginal ultrasound examinations of the pelvis were performed. Transabdominal technique was performed for global imaging of the pelvis including uterus, ovaries, adnexal regions, and pelvic cul-de-sac. It was necessary to proceed with endovaginal exam following the transabdominal exam to visualize the uterus endometrium ovaries. Color and duplex Doppler ultrasound was utilized to evaluate blood flow to the ovaries. COMPARISON:  CT 12/12/2021 FINDINGS: Uterus Measurements: 12.7 x 5.7 x 6 cm = volume: 227 mL. Multiple uterine fibroids posterior submucosal fibroid measuring 3.1 x 2.6 x 2.8 cm. Anterior  subserosal lower uterine segment mass measuring 1.8 x 1.5 x 1.5 cm. Endometrium Thickness: 22.8 mm.  IUD within the upper uterine segment. Right ovary Not seen Left ovary Measurements: 8.7 x 4.8 x 4.3 cm = volume: 94.7 mL. Essentially replaced by adnexal cystic lesion. There is flow present at the periphery. Cystic lesion measures 9.5 x 6.6 by 6.2 cm. It contains at least 1 thin septation. Pulsed Doppler evaluation of both ovaries demonstrates normal low-resistance arterial and venous waveforms. Other findings No abnormal free fluid. IMPRESSION: 1. Nonvisualized right ovary 2. The left ovary is essentially replaced by a septated cystic adnexal lesion measuring up to 9.5 cm. Surgical consultation is recommended. 3. Endometrial thickness of 22.8 mm. Endometrial thickness is considered abnormal. Consider follow-up by [place] in 6-8 weeks, during the week immediately following menses (exam timing is critical). 4. Uterine fibroids Electronically Signed   By: Donavan Foil M.D.   On: 12/12/2021 23:32   CT Renal Stone Study  Result Date: 12/12/2021 CLINICAL DATA:  Nephrolithiasis.  UTI. EXAM: CT ABDOMEN AND PELVIS WITHOUT CONTRAST TECHNIQUE: Multidetector CT imaging of the abdomen and pelvis was performed following the standard protocol without IV contrast. RADIATION DOSE REDUCTION: This exam was performed according to the departmental dose-optimization program which includes automated exposure control, adjustment of the mA and/or kV according to patient size and/or use of iterative reconstruction technique. COMPARISON:  05/07/2017 FINDINGS: Lower chest: Lung bases are clear except for minimal linear scarring. Hepatobiliary: Liver parenchyma is normal.  No calcified gallstones. Pancreas: Normal Spleen: Normal Adrenals/Urinary Tract: Enlargement of the back containing mass of the left adrenal gland consistent with myelolipoma. Overall measurements today are 5.1 x 3.9 x 3.7 cm compared with 3.6 x 2.8 x 2.5 cm previously. No  stone is seen in either kidney. No hydroureteronephrosis. No stone in the bladder. Stomach/Bowel: Stomach and small intestine are normal. No colon pathology. Vascular/Lymphatic: Aorta and IVC are normal.  No adenopathy. Reproductive: IUD in the uterus. Septated cystic abnormality the left ovary measuring 9.3 x 5.0 x 5.1 cm, enlarged since the previous study. Other: No free fluid or air. Musculoskeletal: Ordinary lumbar degenerative changes. IMPRESSION: No CT evidence of urinary tract infection or obstruction. No stone disease. Enlarging myelolipoma the left adrenal gland. Enlarging cystic abnormality of the left adnexa, possibly septated. Overall measurements today are 9.3 x 5.0 x 5.1 cm. Because this lesion is not adequately characterized, prompt [place] is recommended for further evaluation. Note: This recommendation does not apply to premenarchal patients and to those with increased risk (genetic, family history, elevated tumor markers or other high-risk factors) of ovarian cancer. Reference: JACR 2020 Feb; 17(2):248-254 Electronically Signed   By: Nelson Chimes M.D.   On: 12/12/2021 19:08    PROCEDURES:  Critical Care performed: N/A.  Procedures    MEDICATIONS ORDERED IN ED: Medications  sodium chloride 0.9 % bolus 1,000 mL (0 mLs Intravenous Stopped 12/12/21 2339)     IMPRESSION / MDM / ASSESSMENT AND PLAN / ED COURSE  I reviewed the triage vital signs and the nursing notes.                              Differential diagnosis includes, but is not limited to, cystitis, pyelonephritis, ureterolithiasis, ovarian cyst, ovarian torsion.   ED Course Patient appears well, vitals within normal limits.  NAD.  CBC shows no leukocytosis or anemia.  CMP notable for elevated glucose at 299 and elevated creatinine at 1.53 (1.24 approximately 4 months prior).  Will initiate IV fluids.  Urinalysis shows elevated glucose and rare bacteria, but otherwise no significant evidence of urinary tract  infection.  Repeat BMP shows improvement in creatinine and glucose following IV fluids.  Assessment/Plan Patient presents with symptoms of dysuria and pelvic discomfort x1 week.  No evidence of ureterolithiasis or infection, however pelvic ultrasound does reveal  Patient's presentation is most consistent with {EM COPA:27473}   Considered admission for this patient, but ***  ***Provided the patient with anticipatory guidance, return precautions, and educational material. Encouraged the patient to return to the emergency department at any time if they begin to experience any new or worsening symptoms. Patient expressed understanding and agreed with the plan.       FINAL CLINICAL IMPRESSION(S) / ED DIAGNOSES   Final diagnoses:  Cyst of left ovary  Myelolipoma of left adrenal gland     Rx / DC Orders   ED Discharge Orders     None        Note:  This document was prepared using Dragon voice recognition software and may include unintentional dictation errors.

## 2021-12-13 MED ORDER — OXYCODONE-ACETAMINOPHEN 5-325 MG PO TABS: 1.0000 | ORAL_TABLET | Freq: Three times a day (TID) | ORAL | 0 refills | Status: AC | PRN

## 2021-12-13 NOTE — ED Notes (Signed)
Patient discharged to home per MD order. Patient in stable condition, and deemed medically cleared by ED provider for discharge. Discharge instructions reviewed with patient/family using "Teach Back"; verbalized understanding of medication education and administration, and information about follow-up care. Denies further concerns. ° °
